# Patient Record
Sex: Male | Born: 1935 | Race: White | Hispanic: No | Marital: Single | Smoking: Former smoker
Health system: Southern US, Community
[De-identification: ages and names within clinical notes are randomized; demographics above are authoritative.]

## PROBLEM LIST (undated history)

## (undated) DIAGNOSIS — E039 Hypothyroidism, unspecified: Secondary | ICD-10-CM

## (undated) DIAGNOSIS — N183 Chronic kidney disease, stage 3 unspecified: Secondary | ICD-10-CM

## (undated) DIAGNOSIS — R259 Unspecified abnormal involuntary movements: Secondary | ICD-10-CM

## (undated) DIAGNOSIS — K219 Gastro-esophageal reflux disease without esophagitis: Secondary | ICD-10-CM

## (undated) DIAGNOSIS — G252 Other specified forms of tremor: Secondary | ICD-10-CM

## (undated) DIAGNOSIS — I10 Essential (primary) hypertension: Secondary | ICD-10-CM

## (undated) DIAGNOSIS — M1990 Unspecified osteoarthritis, unspecified site: Secondary | ICD-10-CM

## (undated) HISTORY — DX: Unspecified abnormal involuntary movements: R25.9

## (undated) HISTORY — DX: Essential (primary) hypertension: I10

## (undated) HISTORY — DX: Unspecified osteoarthritis, unspecified site: M19.90

## (undated) HISTORY — DX: Other specified forms of tremor: G25.2

## (undated) HISTORY — DX: Hypothyroidism, unspecified: E03.9

## (undated) HISTORY — PX: APPENDECTOMY: SHX54

## (undated) HISTORY — PX: CHOLECYSTECTOMY: SHX55

---

## 2001-01-03 ENCOUNTER — Ambulatory Visit (HOSPITAL_COMMUNITY): Admission: RE | Admit: 2001-01-03 | Discharge: 2001-01-03 | Payer: Self-pay | Admitting: Cardiology

## 2001-01-11 ENCOUNTER — Encounter: Payer: Self-pay | Admitting: General Surgery

## 2001-01-11 ENCOUNTER — Ambulatory Visit (HOSPITAL_COMMUNITY): Admission: RE | Admit: 2001-01-11 | Discharge: 2001-01-12 | Payer: Self-pay | Admitting: General Surgery

## 2002-11-08 ENCOUNTER — Ambulatory Visit (HOSPITAL_COMMUNITY): Admission: RE | Admit: 2002-11-08 | Discharge: 2002-11-08 | Payer: Self-pay | Admitting: Interventional Cardiology

## 2003-10-03 ENCOUNTER — Encounter: Admission: RE | Admit: 2003-10-03 | Discharge: 2003-10-03 | Payer: Self-pay | Admitting: Family Medicine

## 2004-08-20 ENCOUNTER — Encounter: Admission: RE | Admit: 2004-08-20 | Discharge: 2004-08-20 | Payer: Self-pay | Admitting: *Deleted

## 2006-09-27 ENCOUNTER — Ambulatory Visit (HOSPITAL_COMMUNITY): Admission: RE | Admit: 2006-09-27 | Discharge: 2006-09-27 | Payer: Self-pay | Admitting: *Deleted

## 2008-08-07 ENCOUNTER — Emergency Department (HOSPITAL_COMMUNITY): Admission: EM | Admit: 2008-08-07 | Discharge: 2008-08-07 | Payer: Self-pay | Admitting: Emergency Medicine

## 2008-08-23 ENCOUNTER — Ambulatory Visit: Payer: Self-pay | Admitting: Pulmonary Disease

## 2008-08-23 DIAGNOSIS — J8410 Pulmonary fibrosis, unspecified: Secondary | ICD-10-CM

## 2010-09-29 ENCOUNTER — Encounter
Admission: RE | Admit: 2010-09-29 | Discharge: 2010-09-29 | Payer: Self-pay | Source: Home / Self Care | Attending: Chiropractic Medicine | Admitting: Chiropractic Medicine

## 2010-10-10 ENCOUNTER — Observation Stay (HOSPITAL_COMMUNITY)
Admission: EM | Admit: 2010-10-10 | Discharge: 2010-10-11 | Payer: Self-pay | Source: Home / Self Care | Attending: Internal Medicine | Admitting: Internal Medicine
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
## 2010-10-13 LAB — BASIC METABOLIC PANEL
BUN: 13 mg/dL (ref 6–23)
CO2: 24 mEq/L (ref 19–32)
Calcium: 8.7 mg/dL (ref 8.4–10.5)
Calcium: 8.8 mg/dL (ref 8.4–10.5)
Chloride: 106 mEq/L (ref 96–112)
Creatinine, Ser: 1.3 mg/dL (ref 0.4–1.5)
GFR calc Af Amer: >60 mL/min (ref >60)
GFR calc Af Amer: >60 mL/min (ref >60)
Glucose, Bld: 110 mg/dL — ABNORMAL HIGH (ref 70–99)
Sodium: 134 mEq/L — ABNORMAL LOW (ref 135–145)
Sodium: 138 mEq/L (ref 135–145)

## 2010-10-13 LAB — CBC
HCT: 26.6 % — ABNORMAL LOW (ref 39.0–52.0)
HCT: 27.4 % — ABNORMAL LOW (ref 39.0–52.0)
Hemoglobin: 9.1 g/dL — ABNORMAL LOW (ref 13.0–17.0)
Hemoglobin: 8.8 g/dL — ABNORMAL LOW (ref 13.0–17.0)
MCH: 32.4 pg (ref 26.0–34.0)
MCH: 32.7 pg (ref 26.0–34.0)
MCHC: 33.3 g/dL (ref 30.0–36.0)
MCV: 97.5 fL (ref 78.0–100.0)
Platelets: 170 10*3/uL (ref 150–400)
RBC: 2.71 MIL/uL — ABNORMAL LOW (ref 4.22–5.81)
RDW: 15.6 % — ABNORMAL HIGH (ref 11.5–15.5)
RDW: 15.7 % — ABNORMAL HIGH (ref 11.5–15.5)
WBC: 5 10*3/uL (ref 4.0–10.5)
WBC: 5.2 10*3/uL (ref 4.0–10.5)

## 2010-10-13 LAB — COMPREHENSIVE METABOLIC PANEL
ALT: 27 U/L (ref 0–53)
AST: 81 U/L — ABNORMAL HIGH (ref 0–37)
Albumin: 3.2 g/dL — ABNORMAL LOW (ref 3.5–5.2)
BUN: 14 mg/dL (ref 6–23)
BUN: 13 mg/dL (ref 6–23)
CO2: 24 mEq/L (ref 19–32)
Calcium: 8.7 mg/dL (ref 8.4–10.5)
Creatinine, Ser: 1.32 mg/dL (ref 0.4–1.5)
Creatinine, Ser: 1.27 mg/dL (ref 0.4–1.5)
GFR calc non Af Amer: 53 mL/min — ABNORMAL LOW (ref >60)
Glucose, Bld: 107 mg/dL — ABNORMAL HIGH (ref 70–99)
Potassium: 5.6 mEq/L — ABNORMAL HIGH (ref 3.5–5.1)
Sodium: 136 mEq/L (ref 135–145)
Sodium: 139 mEq/L (ref 135–145)
Total Bilirubin: 1.5 mg/dL — ABNORMAL HIGH (ref 0.3–1.2)
Total Bilirubin: 0.9 mg/dL (ref 0.3–1.2)
Total Protein: 7.2 g/dL (ref 6.0–8.3)

## 2010-10-13 LAB — URINALYSIS, ROUTINE W REFLEX MICROSCOPIC
Leukocytes, UA: NEGATIVE
Urine Glucose, Fasting: NEGATIVE mg/dL

## 2010-10-13 LAB — DIFFERENTIAL
Basophils Absolute: 0 10*3/uL (ref 0.0–0.1)
Basophils Absolute: 0 10*3/uL (ref 0.0–0.1)
Basophils Relative: 1 % (ref 0–1)
Eosinophils Absolute: 0.2 10*3/uL (ref 0.0–0.7)
Eosinophils Relative: 4 % (ref 0–5)
Lymphs Abs: 1.8 10*3/uL (ref 0.7–4.0)
Lymphs Abs: 1.9 10*3/uL (ref 0.7–4.0)
Monocytes Absolute: 0.6 10*3/uL (ref 0.1–1.0)
Monocytes Relative: 10 % (ref 3–12)
Monocytes Relative: 11 % (ref 3–12)
Neutro Abs: 2.5 10*3/uL (ref 1.7–7.7)
Neutrophils Relative %: 48 % (ref 43–77)

## 2010-10-13 LAB — IRON AND TIBC
Iron: 46 ug/dL (ref 42–135)
Saturation Ratios: 19 % — ABNORMAL LOW (ref 20–55)
TIBC: 245 ug/dL (ref 215–435)

## 2010-10-13 LAB — POCT CARDIAC MARKERS: CKMB, poc: <1 ng/mL — ABNORMAL LOW (ref 1.0–8.0)

## 2010-10-13 LAB — CARDIAC PANEL(CRET KIN+CKTOT+MB+TROPI)
Total CK: 267 U/L — ABNORMAL HIGH (ref 7–232)
Troponin I: 0.02 ng/mL (ref 0.00–0.06)

## 2010-10-13 LAB — PROTIME-INR: INR: 1.28 (ref 0.00–1.49)

## 2010-10-22 ENCOUNTER — Other Ambulatory Visit: Payer: Self-pay | Admitting: Gastroenterology

## 2011-02-05 NOTE — Cardiovascular Report (Signed)
NAME:  Bernard [name], WOLDEN NO.:  [id number]   MEDICAL RECORD NO.:  [id number]          PATIENT TYPE:  OIB   LOCATION:  2899                         FACILITY:  [facility]   PHYSICIAN:  Elmore Guise., M.D.DATE OF BIRTH:  1919-06-18   DATE OF PROCEDURE:  09/27/2006  DATE OF DISCHARGE:                            CARDIAC CATHETERIZATION   INDICATIONS FOR PROCEDURE:  Increasing exertional chest pain with  multiple cardiac risk factors.   PROCEDURE DESCRIPTION:  The patient was brought to the cardiac cath lab  after appropriate informed consent.  He was prepped and draped in  sterile fashion.  Approximately 10 mL of 1% lidocaine was used for local  anesthesia.  A 6-French sheath was placed in the right femoral artery  without difficulty.  Coronary angiography, LV angiography and limited  right femoral arteriography were then performed.  The patient tolerated  the procedure well with no apparent complications.  The patient was  transferred from the cardiac catheterization lab in stable condition.   FINDINGS:  1. Left main:  Normal.  2. Left anterior descending:  Proximal luminal irregularities with mid      20-30% stenosis and distal luminal irregularities.  3. Diagonal 1:  Small vessel with mild luminal irregularities.  4. Diagonal 2:  Moderate-sized vessel with mild luminal      irregularities.  5. Left circumflex:  Nondominant with mild luminal irregularities.  6. Obtuse marginal 1/obtuse marginal 2:  Both large vessels with mild      luminal irregularities.  7. Right coronary artery:  Dominant with mild luminal irregularities.  8. PDA/PLV:  Mild luminal irregularities.  9. Left ventricle:  Ejection fraction is 50%.  LVEDP is 21 mmHg.  10.Limited right femoral angiogram showed mild tortuosity but no      significant obstructive disease   IMPRESSION:  1. Nonobstructive coronary arteries with mild luminal irregularities      as described.  2. Preserved left  ventricular systolic function with an ejection      fraction of 50-55%.   PLAN:  Aggressive risk factor modification as indicated.      Elmore Guise., M.D.  Electronically Signed     TWK/MEDQ  D:  09/27/2006  T:  09/27/2006  Job:  [id number]   cc:   Caryn Bee L. Little, M.D.

## 2011-02-12 ENCOUNTER — Other Ambulatory Visit: Payer: Self-pay | Admitting: Family Medicine

## 2011-02-12 DIAGNOSIS — N289 Disorder of kidney and ureter, unspecified: Secondary | ICD-10-CM

## 2011-02-16 ENCOUNTER — Ambulatory Visit
Admission: RE | Admit: 2011-02-16 | Discharge: 2011-02-16 | Disposition: A | Discharge status: Home / Self Care | Payer: Medicare Other | Source: Ambulatory Visit | Attending: Family Medicine | Admitting: Family Medicine

## 2011-02-16 DIAGNOSIS — N289 Disorder of kidney and ureter, unspecified: Secondary | ICD-10-CM

## 2011-06-23 LAB — URINALYSIS, ROUTINE W REFLEX MICROSCOPIC
Glucose, UA: NEGATIVE
Specific Gravity, Urine: 1.02
pH: 5.5

## 2011-06-23 LAB — CBC
Hemoglobin: 10.9 — ABNORMAL LOW
RBC: 3.24 — ABNORMAL LOW
RDW: 14.1
WBC: 9.1

## 2011-06-23 LAB — COMPREHENSIVE METABOLIC PANEL
ALT: 28
Alkaline Phosphatase: 85
CO2: 26
Chloride: 108
Glucose, Bld: 98
Potassium: 6 — ABNORMAL HIGH
Sodium: 139
Total Bilirubin: 2.3 — ABNORMAL HIGH
Total Protein: 7.3

## 2011-06-23 LAB — DIFFERENTIAL
Basophils Absolute: 0
Basophils Relative: 0
Eosinophils Absolute: 0.2
Eosinophils Relative: 2
Lymphocytes Relative: 14
Lymphs Abs: 1.2
Monocytes Absolute: 0.7
Monocytes Relative: 8
Neutro Abs: 7
Neutrophils Relative %: 76

## 2011-06-23 LAB — POCT CARDIAC MARKERS: CKMB, poc: 1.6

## 2011-06-23 LAB — URINE MICROSCOPIC-ADD ON: Urine-Other: NONE SEEN

## 2011-06-23 LAB — LIPASE, BLOOD: Lipase: 102 — ABNORMAL HIGH

## 2012-02-24 ENCOUNTER — Other Ambulatory Visit: Payer: Self-pay | Admitting: Family Medicine

## 2012-02-24 DIAGNOSIS — J309 Allergic rhinitis, unspecified: Secondary | ICD-10-CM

## 2012-02-25 ENCOUNTER — Ambulatory Visit
Admission: RE | Admit: 2012-02-25 | Discharge: 2012-02-25 | Disposition: A | Discharge status: Home / Self Care | Payer: Medicare Other | Source: Ambulatory Visit | Attending: Family Medicine | Admitting: Family Medicine

## 2012-02-25 DIAGNOSIS — J309 Allergic rhinitis, unspecified: Secondary | ICD-10-CM

## 2012-03-02 ENCOUNTER — Encounter: Payer: Self-pay | Admitting: *Deleted

## 2012-03-23 IMAGING — CR DG LUMBAR SPINE COMPLETE 4+V
5 series · 5 of 5 positions shown · non-contrast
Comparison: None

CLINICAL DATA: Upper, mid and low back pain, leg pain

LUMBAR SPINE - COMPLETE 4+ VIEW

[t l-spine a.p.]
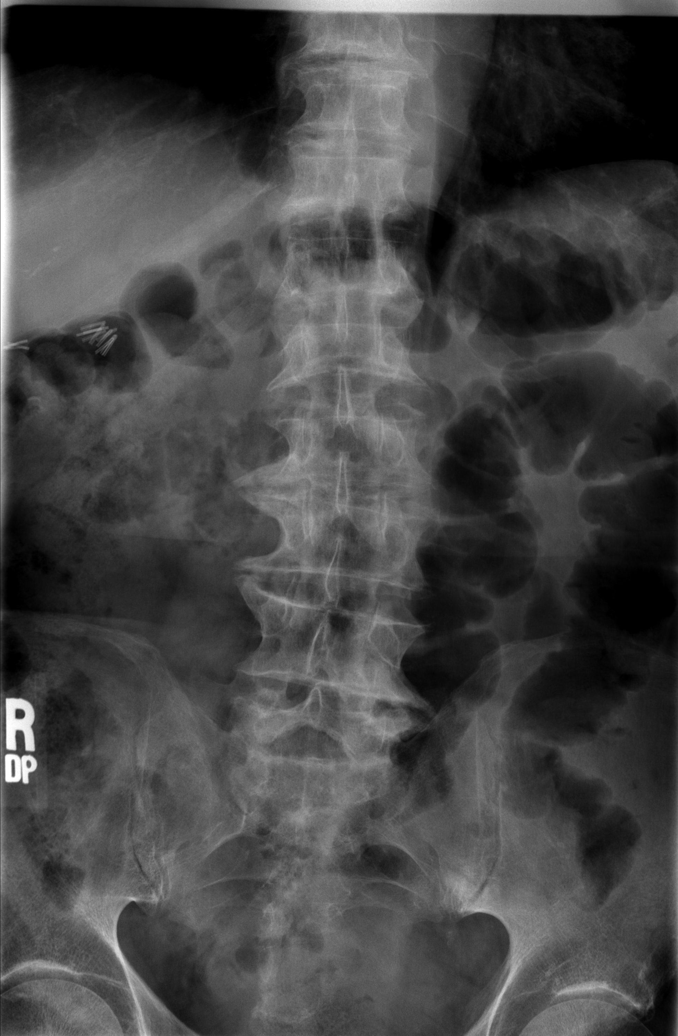

[t l-spine oblique exposure (1 of 2)]
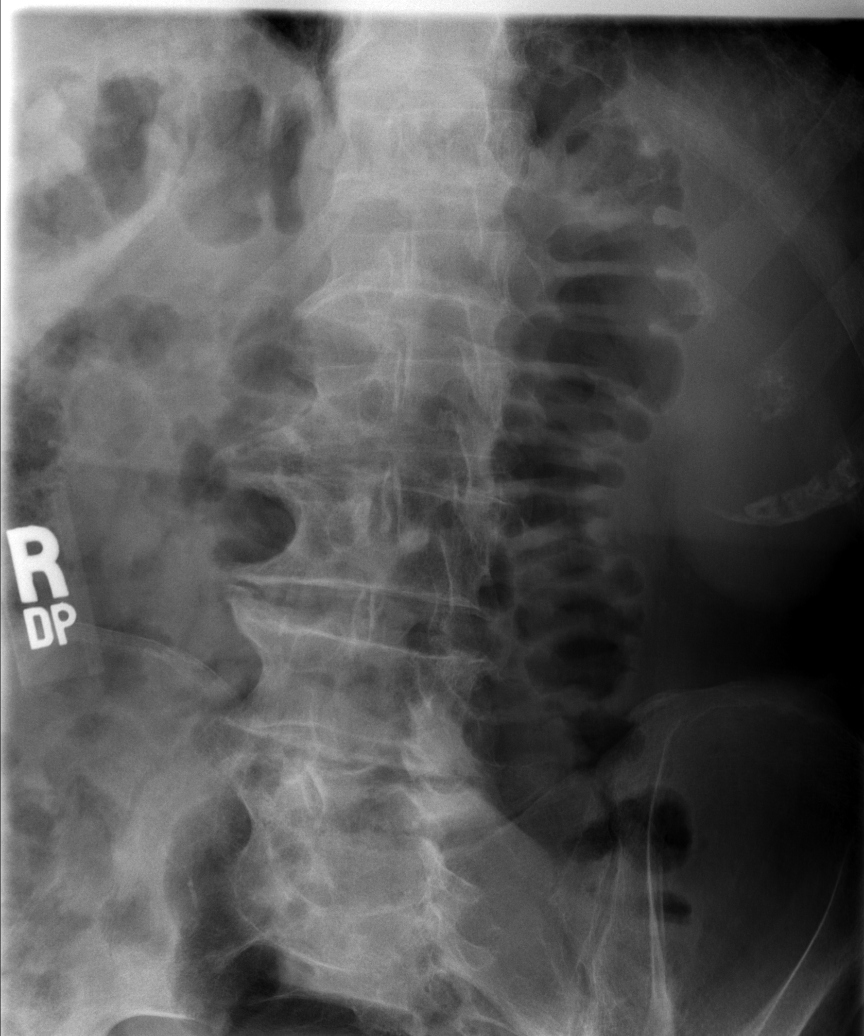

[t l-spine oblique exposure (2 of 2)]
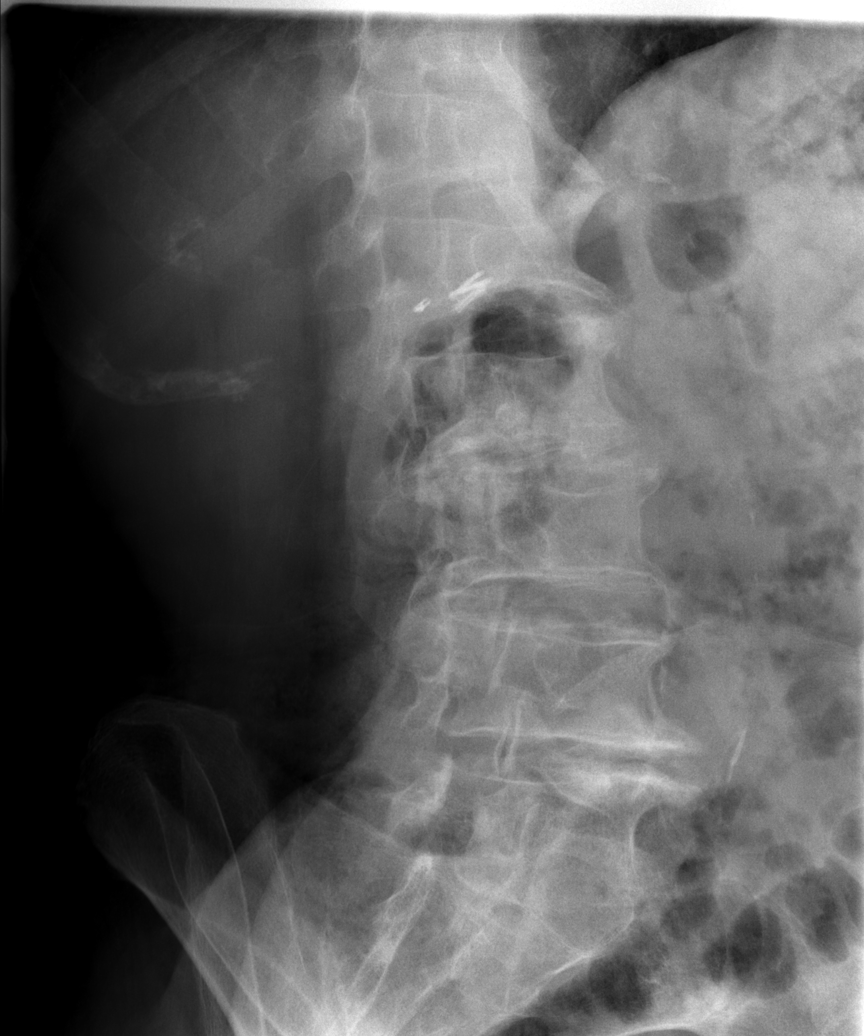

[t l-spine lat]
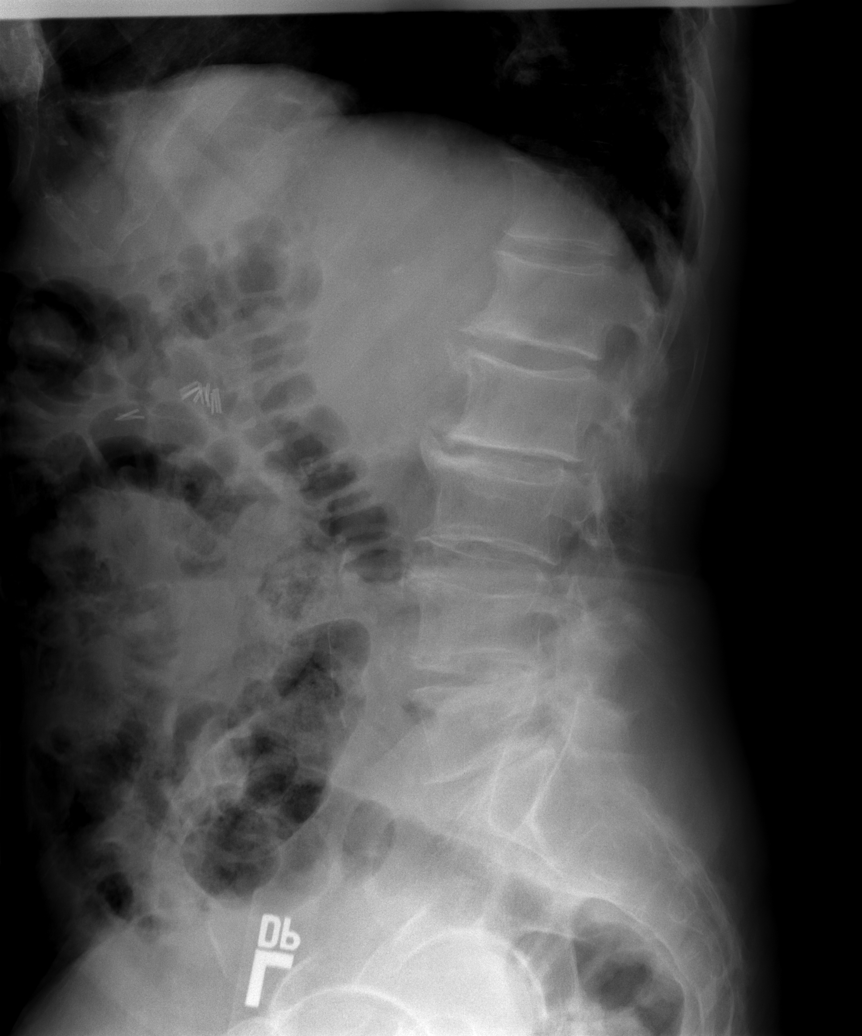

[t l-spine l5-s1 spot]
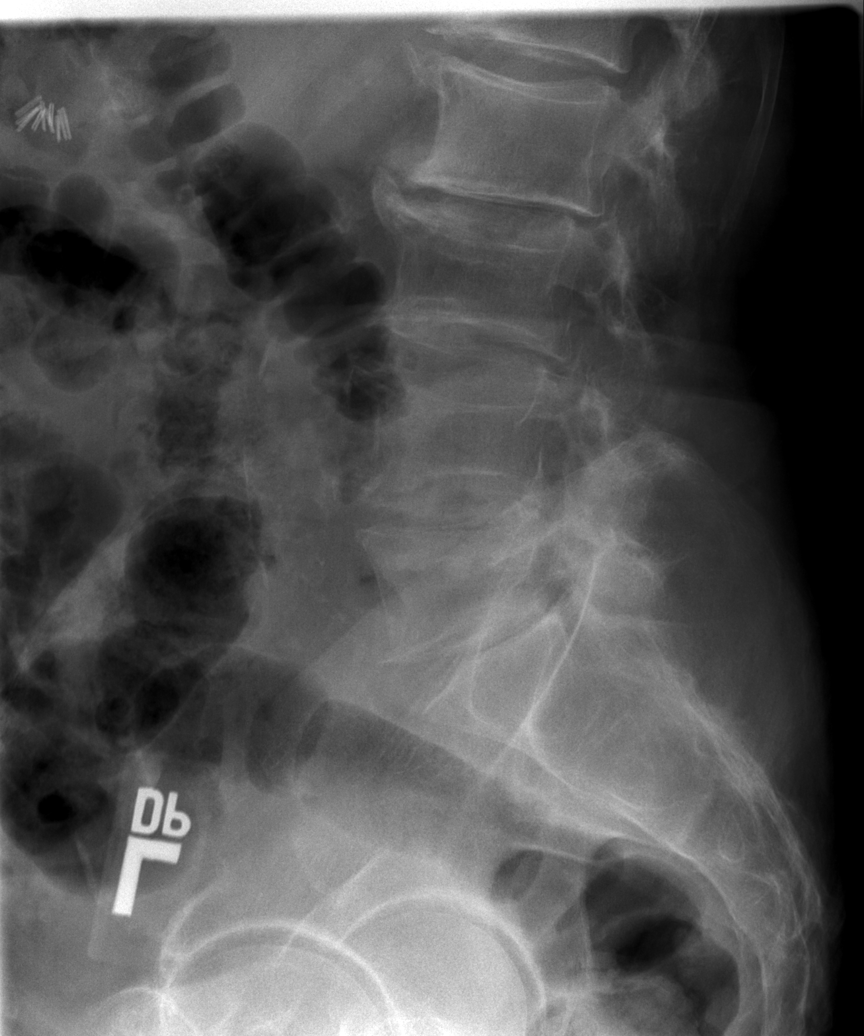

[5 of 5 positions shown; findings below may reference images not displayed]

FINDINGS: Five non-rib bearing lumbar vertebrae.
Osseous demineralization.
Multilevel disc space narrowing endplate spur formation.
Mild facet degenerative changes lower lumbar spine.
No acute fracture, subluxation or bone destruction.
No spondylolysis.
SI joints symmetric.
IMPRESSION: Multilevel degenerative disc disease changes thoracic spine.
No acute osseous findings.

## 2012-04-25 ENCOUNTER — Other Ambulatory Visit: Payer: Self-pay | Admitting: Family Medicine

## 2012-04-25 DIAGNOSIS — R109 Unspecified abdominal pain: Secondary | ICD-10-CM

## 2012-04-27 ENCOUNTER — Ambulatory Visit
Admission: RE | Admit: 2012-04-27 | Discharge: 2012-04-27 | Disposition: A | Discharge status: Home / Self Care | Payer: Medicare Other | Source: Ambulatory Visit | Attending: Family Medicine | Admitting: Family Medicine

## 2012-04-27 DIAGNOSIS — R109 Unspecified abdominal pain: Secondary | ICD-10-CM

## 2012-10-12 ENCOUNTER — Other Ambulatory Visit: Payer: Self-pay | Admitting: Neurology

## 2012-10-12 DIAGNOSIS — G243 Spasmodic torticollis: Secondary | ICD-10-CM

## 2012-10-12 DIAGNOSIS — E039 Hypothyroidism, unspecified: Secondary | ICD-10-CM

## 2012-10-12 DIAGNOSIS — R251 Tremor, unspecified: Secondary | ICD-10-CM

## 2012-10-12 DIAGNOSIS — R51 Headache: Secondary | ICD-10-CM

## 2012-10-18 ENCOUNTER — Other Ambulatory Visit: Payer: Medicare Other

## 2012-11-06 ENCOUNTER — Other Ambulatory Visit: Payer: Medicare Other

## 2012-11-13 ENCOUNTER — Encounter: Payer: Self-pay | Admitting: *Deleted

## 2013-05-08 ENCOUNTER — Encounter (HOSPITAL_COMMUNITY): Payer: Self-pay | Admitting: Emergency Medicine

## 2013-05-08 ENCOUNTER — Emergency Department (HOSPITAL_COMMUNITY)
Admission: EM | Admit: 2013-05-08 | Discharge: 2013-05-08 | Disposition: A | Discharge status: Home / Self Care | Payer: Medicare Other | Attending: Emergency Medicine | Admitting: Emergency Medicine

## 2013-05-08 DIAGNOSIS — I10 Essential (primary) hypertension: Secondary | ICD-10-CM | POA: Insufficient documentation

## 2013-05-08 DIAGNOSIS — Z87891 Personal history of nicotine dependence: Secondary | ICD-10-CM | POA: Insufficient documentation

## 2013-05-08 DIAGNOSIS — Z79899 Other long term (current) drug therapy: Secondary | ICD-10-CM | POA: Insufficient documentation

## 2013-05-08 DIAGNOSIS — F0390 Unspecified dementia without behavioral disturbance: Secondary | ICD-10-CM | POA: Insufficient documentation

## 2013-05-08 DIAGNOSIS — E039 Hypothyroidism, unspecified: Secondary | ICD-10-CM | POA: Insufficient documentation

## 2013-05-08 DIAGNOSIS — Z8739 Personal history of other diseases of the musculoskeletal system and connective tissue: Secondary | ICD-10-CM | POA: Insufficient documentation
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
## 2013-05-28 ENCOUNTER — Other Ambulatory Visit: Payer: Self-pay | Admitting: Family Medicine

## 2013-05-28 DIAGNOSIS — R413 Other amnesia: Secondary | ICD-10-CM

## 2013-05-30 ENCOUNTER — Other Ambulatory Visit: Payer: Medicare Other

## 2013-06-02 ENCOUNTER — Ambulatory Visit
Admission: RE | Admit: 2013-06-02 | Discharge: 2013-06-02 | Disposition: A | Discharge status: Home / Self Care | Payer: Medicare Other | Source: Ambulatory Visit | Attending: Family Medicine | Admitting: Family Medicine

## 2013-06-02 ENCOUNTER — Other Ambulatory Visit: Payer: Medicare Other

## 2013-06-02 DIAGNOSIS — R413 Other amnesia: Secondary | ICD-10-CM

## 2014-05-25 ENCOUNTER — Encounter: Payer: Self-pay | Admitting: *Deleted

## 2015-08-23 ENCOUNTER — Inpatient Hospital Stay
Admission: EM | Admit: 2015-08-23 | Discharge: 2015-08-27 | DRG: 682 | Disposition: A | Discharge status: Skilled Nursing Facility | Payer: Medicare Other | Attending: Internal Medicine | Admitting: Internal Medicine

## 2015-08-23 DIAGNOSIS — N179 Acute kidney failure, unspecified: Secondary | ICD-10-CM

## 2015-08-23 DIAGNOSIS — I130 Hypertensive heart and chronic kidney disease with heart failure and stage 1 through stage 4 chronic kidney disease, or unspecified chronic kidney disease: Secondary | ICD-10-CM | POA: Diagnosis present

## 2015-08-23 DIAGNOSIS — N170 Acute kidney failure with tubular necrosis: Secondary | ICD-10-CM | POA: Diagnosis not present

## 2015-08-23 DIAGNOSIS — R627 Adult failure to thrive: Secondary | ICD-10-CM | POA: Diagnosis present

## 2015-08-23 DIAGNOSIS — D61818 Other pancytopenia: Secondary | ICD-10-CM | POA: Diagnosis present

## 2015-08-23 DIAGNOSIS — F0390 Unspecified dementia without behavioral disturbance: Secondary | ICD-10-CM | POA: Diagnosis present

## 2015-08-23 DIAGNOSIS — Z515 Encounter for palliative care: Secondary | ICD-10-CM | POA: Diagnosis present

## 2015-08-23 DIAGNOSIS — M1990 Unspecified osteoarthritis, unspecified site: Secondary | ICD-10-CM | POA: Diagnosis present

## 2015-08-23 DIAGNOSIS — I350 Nonrheumatic aortic (valve) stenosis: Secondary | ICD-10-CM | POA: Diagnosis present

## 2015-08-23 DIAGNOSIS — E43 Unspecified severe protein-calorie malnutrition: Secondary | ICD-10-CM | POA: Insufficient documentation

## 2015-08-23 DIAGNOSIS — Z66 Do not resuscitate: Secondary | ICD-10-CM | POA: Diagnosis present

## 2015-08-23 DIAGNOSIS — E872 Acidosis: Secondary | ICD-10-CM | POA: Diagnosis present

## 2015-08-23 DIAGNOSIS — Z9049 Acquired absence of other specified parts of digestive tract: Secondary | ICD-10-CM

## 2015-08-23 DIAGNOSIS — Z87891 Personal history of nicotine dependence: Secondary | ICD-10-CM

## 2015-08-23 DIAGNOSIS — J8410 Pulmonary fibrosis, unspecified: Secondary | ICD-10-CM | POA: Diagnosis present

## 2015-08-23 DIAGNOSIS — N189 Chronic kidney disease, unspecified: Secondary | ICD-10-CM | POA: Diagnosis present

## 2015-08-23 DIAGNOSIS — K922 Gastrointestinal hemorrhage, unspecified: Secondary | ICD-10-CM

## 2015-08-23 DIAGNOSIS — I739 Peripheral vascular disease, unspecified: Secondary | ICD-10-CM | POA: Diagnosis present

## 2015-08-23 DIAGNOSIS — I272 Other secondary pulmonary hypertension: Secondary | ICD-10-CM | POA: Diagnosis present

## 2015-08-23 DIAGNOSIS — E039 Hypothyroidism, unspecified: Secondary | ICD-10-CM | POA: Diagnosis present

## 2015-08-23 DIAGNOSIS — J439 Emphysema, unspecified: Secondary | ICD-10-CM | POA: Diagnosis present

## 2015-08-23 DIAGNOSIS — I5032 Chronic diastolic (congestive) heart failure: Secondary | ICD-10-CM | POA: Diagnosis present

## 2015-08-23 DIAGNOSIS — D649 Anemia, unspecified: Secondary | ICD-10-CM

## 2015-08-23 DIAGNOSIS — E860 Dehydration: Secondary | ICD-10-CM | POA: Diagnosis not present

## 2015-08-23 DIAGNOSIS — N400 Enlarged prostate without lower urinary tract symptoms: Secondary | ICD-10-CM | POA: Diagnosis present

## 2015-08-23 DIAGNOSIS — I2510 Atherosclerotic heart disease of native coronary artery without angina pectoris: Secondary | ICD-10-CM | POA: Diagnosis present

## 2015-08-23 DIAGNOSIS — D414 Neoplasm of uncertain behavior of bladder: Secondary | ICD-10-CM | POA: Diagnosis present

## 2015-08-23 LAB — COMPREHENSIVE METABOLIC PANEL
ALK PHOS: 78 U/L (ref 38–126)
ALT: 20 U/L (ref 17–63)
ANION GAP: 12 (ref 5–15)
AST: 74 U/L — ABNORMAL HIGH (ref 15–41)
Albumin: 3.9 g/dL (ref 3.5–5.0)
BILIRUBIN TOTAL: 3.5 mg/dL — AB (ref 0.3–1.2)
BUN: 59 mg/dL — ABNORMAL HIGH (ref 6–20)
CALCIUM: 9.5 mg/dL (ref 8.9–10.3)
CO2: 21 mmol/L — ABNORMAL LOW (ref 22–32)
Chloride: 104 mmol/L (ref 101–111)
Creatinine, Ser: 2.81 mg/dL — ABNORMAL HIGH (ref 0.61–1.24)
GFR calc non Af Amer: 18 mL/min — ABNORMAL LOW (ref >60)
GFR, EST AFRICAN AMERICAN: 21 mL/min — AB (ref >60)
Glucose, Bld: 153 mg/dL — ABNORMAL HIGH (ref 65–99)
Potassium: 4.7 mmol/L (ref 3.5–5.1)
Sodium: 137 mmol/L (ref 135–145)
TOTAL PROTEIN: 7.4 g/dL (ref 6.5–8.1)

## 2015-08-23 LAB — CBC WITH DIFFERENTIAL/PLATELET
Basophils Absolute: 0.1 10*3/uL (ref 0–0.1)
Basophils Relative: 0 %
Eosinophils Absolute: 0.1 10*3/uL (ref 0–0.7)
Eosinophils Relative: 1 %
HEMATOCRIT: 34.2 % — AB (ref 40.0–52.0)
Hemoglobin: 11.8 g/dL — ABNORMAL LOW (ref 13.0–18.0)
LYMPHS PCT: 26 %
Lymphs Abs: 3.3 10*3/uL (ref 1.0–3.6)
MCH: 31.3 pg (ref 26.0–34.0)
MCHC: 34.4 g/dL (ref 32.0–36.0)
MCV: 90.7 fL (ref 80.0–100.0)
MONO ABS: 1.1 10*3/uL — AB (ref 0.2–1.0)
MONOS PCT: 9 %
NEUTROS ABS: 8.1 10*3/uL — AB (ref 1.4–6.5)
Neutrophils Relative %: 64 %
Platelets: 275 10*3/uL (ref 150–440)
RBC: 3.77 MIL/uL — ABNORMAL LOW (ref 4.40–5.90)
RDW: 17.9 % — AB (ref 11.5–14.5)
WBC: 12.6 10*3/uL — ABNORMAL HIGH (ref 3.8–10.6)

## 2015-08-23 LAB — PROTIME-INR
INR: 1.5
PROTHROMBIN TIME: 18.2 s — AB (ref 11.4–15.0)

## 2015-08-23 LAB — LIPASE, BLOOD: Lipase: 39 U/L (ref 11–51)

## 2015-08-23 LAB — LACTIC ACID, PLASMA: Lactic Acid, Venous: 1.9 mmol/L (ref 0.5–2.0)

## 2015-08-23 MED ORDER — SODIUM CHLORIDE 0.9 % IV BOLUS (SEPSIS)
500.0000 mL | Freq: Once | INTRAVENOUS | Status: AC
Start: 2015-08-23 — End: 2015-08-24
  Administered 2015-08-23: 500 mL via INTRAVENOUS

## 2015-08-23 NOTE — ED Notes (Signed)
Pt. Here from the [facility] for nausea, vomiting and diarrhea.

## 2015-08-24 ENCOUNTER — Inpatient Hospital Stay: Payer: Medicare Other

## 2015-08-24 ENCOUNTER — Encounter: Payer: Self-pay | Admitting: Internal Medicine

## 2015-08-24 DIAGNOSIS — I350 Nonrheumatic aortic (valve) stenosis: Secondary | ICD-10-CM | POA: Diagnosis present

## 2015-08-24 DIAGNOSIS — N170 Acute kidney failure with tubular necrosis: Secondary | ICD-10-CM | POA: Diagnosis present

## 2015-08-24 DIAGNOSIS — D61818 Other pancytopenia: Secondary | ICD-10-CM | POA: Diagnosis present

## 2015-08-24 DIAGNOSIS — Z66 Do not resuscitate: Secondary | ICD-10-CM | POA: Diagnosis present

## 2015-08-24 DIAGNOSIS — E860 Dehydration: Secondary | ICD-10-CM | POA: Diagnosis present

## 2015-08-24 DIAGNOSIS — E872 Acidosis: Secondary | ICD-10-CM | POA: Diagnosis present

## 2015-08-24 DIAGNOSIS — E039 Hypothyroidism, unspecified: Secondary | ICD-10-CM | POA: Diagnosis present

## 2015-08-24 DIAGNOSIS — I5032 Chronic diastolic (congestive) heart failure: Secondary | ICD-10-CM | POA: Diagnosis present

## 2015-08-24 DIAGNOSIS — I739 Peripheral vascular disease, unspecified: Secondary | ICD-10-CM | POA: Diagnosis present

## 2015-08-24 DIAGNOSIS — N189 Chronic kidney disease, unspecified: Secondary | ICD-10-CM | POA: Diagnosis present

## 2015-08-24 DIAGNOSIS — I130 Hypertensive heart and chronic kidney disease with heart failure and stage 1 through stage 4 chronic kidney disease, or unspecified chronic kidney disease: Secondary | ICD-10-CM | POA: Diagnosis present

## 2015-08-24 DIAGNOSIS — J439 Emphysema, unspecified: Secondary | ICD-10-CM | POA: Diagnosis present

## 2015-08-24 DIAGNOSIS — M1990 Unspecified osteoarthritis, unspecified site: Secondary | ICD-10-CM | POA: Diagnosis present

## 2015-08-24 DIAGNOSIS — Z9049 Acquired absence of other specified parts of digestive tract: Secondary | ICD-10-CM | POA: Diagnosis not present

## 2015-08-24 DIAGNOSIS — D414 Neoplasm of uncertain behavior of bladder: Secondary | ICD-10-CM | POA: Diagnosis present

## 2015-08-24 DIAGNOSIS — Z87891 Personal history of nicotine dependence: Secondary | ICD-10-CM | POA: Diagnosis not present

## 2015-08-24 DIAGNOSIS — D649 Anemia, unspecified: Secondary | ICD-10-CM | POA: Diagnosis present

## 2015-08-24 DIAGNOSIS — R627 Adult failure to thrive: Secondary | ICD-10-CM | POA: Diagnosis present

## 2015-08-24 DIAGNOSIS — N179 Acute kidney failure, unspecified: Secondary | ICD-10-CM | POA: Diagnosis present

## 2015-08-24 DIAGNOSIS — I272 Other secondary pulmonary hypertension: Secondary | ICD-10-CM | POA: Diagnosis present

## 2015-08-24 DIAGNOSIS — E43 Unspecified severe protein-calorie malnutrition: Secondary | ICD-10-CM | POA: Diagnosis present

## 2015-08-24 DIAGNOSIS — I2510 Atherosclerotic heart disease of native coronary artery without angina pectoris: Secondary | ICD-10-CM | POA: Diagnosis present

## 2015-08-24 DIAGNOSIS — F0390 Unspecified dementia without behavioral disturbance: Secondary | ICD-10-CM | POA: Diagnosis not present

## 2015-08-24 DIAGNOSIS — R109 Unspecified abdominal pain: Secondary | ICD-10-CM | POA: Diagnosis not present

## 2015-08-24 DIAGNOSIS — N400 Enlarged prostate without lower urinary tract symptoms: Secondary | ICD-10-CM | POA: Diagnosis present

## 2015-08-24 DIAGNOSIS — J8410 Pulmonary fibrosis, unspecified: Secondary | ICD-10-CM | POA: Diagnosis present

## 2015-08-24 DIAGNOSIS — Z515 Encounter for palliative care: Secondary | ICD-10-CM | POA: Diagnosis present

## 2015-08-24 LAB — URINALYSIS COMPLETE WITH MICROSCOPIC (ARMC ONLY)
Bilirubin Urine: NEGATIVE
GLUCOSE, UA: NEGATIVE mg/dL
LEUKOCYTES UA: NEGATIVE
NITRITE: NEGATIVE
PROTEIN: 30 mg/dL — AB
SPECIFIC GRAVITY, URINE: 1.016 (ref 1.005–1.030)
WBC UA: NONE SEEN WBC/hpf (ref 0–5)
pH: 5 (ref 5.0–8.0)

## 2015-08-24 LAB — CBC
HCT: 30.3 % — ABNORMAL LOW (ref 40.0–52.0)
Hemoglobin: 10.5 g/dL — ABNORMAL LOW (ref 13.0–18.0)
MCH: 31.7 pg (ref 26.0–34.0)
MCHC: 34.7 g/dL (ref 32.0–36.0)
MCV: 91.2 fL (ref 80.0–100.0)
PLATELETS: 196 10*3/uL (ref 150–440)
RBC: 3.32 MIL/uL — AB (ref 4.40–5.90)
RDW: 17.8 % — ABNORMAL HIGH (ref 11.5–14.5)
WBC: 10.6 10*3/uL (ref 3.8–10.6)

## 2015-08-24 LAB — BASIC METABOLIC PANEL
Anion gap: 9 (ref 5–15)
BUN: 58 mg/dL — ABNORMAL HIGH (ref 6–20)
CALCIUM: 9.2 mg/dL (ref 8.9–10.3)
CO2: 23 mmol/L (ref 22–32)
CREATININE: 2.66 mg/dL — AB (ref 0.61–1.24)
Chloride: 108 mmol/L (ref 101–111)
GFR calc non Af Amer: 19 mL/min — ABNORMAL LOW (ref >60)
GFR, EST AFRICAN AMERICAN: 22 mL/min — AB (ref >60)
Glucose, Bld: 98 mg/dL (ref 65–99)
Potassium: 4.5 mmol/L (ref 3.5–5.1)
SODIUM: 140 mmol/L (ref 135–145)

## 2015-08-24 LAB — LACTIC ACID, PLASMA: LACTIC ACID, VENOUS: 1 mmol/L (ref 0.5–2.0)

## 2015-08-24 MED ORDER — OXYCODONE HCL 5 MG PO TABS
5.0000 mg | ORAL_TABLET | ORAL | Status: DC | PRN
Start: 2015-08-24 — End: 2015-08-27

## 2015-08-24 MED ORDER — ONDANSETRON HCL 4 MG PO TABS
4.0000 mg | ORAL_TABLET | Freq: Four times a day (QID) | ORAL | Status: DC | PRN
  Administered 2015-08-24: 4 mg via ORAL
  Filled 2015-08-24: qty 1

## 2015-08-24 MED ORDER — SODIUM CHLORIDE 0.9 % IV BOLUS (SEPSIS)
1000.0000 mL | Freq: Once | INTRAVENOUS | Status: AC
  Administered 2015-08-24: 1000 mL via INTRAVENOUS

## 2015-08-24 MED ORDER — LEVOTHYROXINE SODIUM 125 MCG PO TABS
125.0000 ug | ORAL_TABLET | Freq: Every day | ORAL | Status: DC
  Administered 2015-08-24 – 2015-08-27 (×4): 125 ug via ORAL
  Filled 2015-08-24 (×4): qty 1

## 2015-08-24 MED ORDER — SODIUM CHLORIDE 0.9 % IV BOLUS (SEPSIS)
500.0000 mL | Freq: Once | INTRAVENOUS | Status: AC
  Administered 2015-08-24: 500 mL via INTRAVENOUS

## 2015-08-24 MED ORDER — ACETAMINOPHEN 650 MG RE SUPP: 650.0000 mg | Freq: Four times a day (QID) | RECTAL | Status: DC | PRN

## 2015-08-24 MED ORDER — VITAMIN B-12 100 MCG PO TABS
100.0000 ug | ORAL_TABLET | Freq: Every day | ORAL | Status: DC
  Administered 2015-08-24 – 2015-08-26 (×3): 100 ug via ORAL
  Filled 2015-08-24 (×3): qty 1

## 2015-08-24 MED ORDER — ACETAMINOPHEN 325 MG PO TABS: 650.0000 mg | ORAL_TABLET | Freq: Four times a day (QID) | ORAL | Status: DC | PRN

## 2015-08-24 MED ORDER — ONDANSETRON HCL 4 MG/2ML IJ SOLN
4.0000 mg | Freq: Four times a day (QID) | INTRAMUSCULAR | Status: DC | PRN
  Filled 2015-08-24: qty 2

## 2015-08-24 MED ORDER — ADULT MULTIVITAMIN W/MINERALS CH
1.0000 | ORAL_TABLET | Freq: Every day | ORAL | Status: DC
  Administered 2015-08-24 – 2015-08-26 (×3): 1 via ORAL
  Filled 2015-08-24 (×3): qty 1

## 2015-08-24 MED ORDER — HEPARIN SODIUM (PORCINE) 5000 UNIT/ML IJ SOLN
5000.0000 [IU] | Freq: Three times a day (TID) | INTRAMUSCULAR | Status: DC
  Administered 2015-08-24 – 2015-08-27 (×12): 5000 [IU] via SUBCUTANEOUS
  Filled 2015-08-24 (×2): qty 1
  Filled 2015-08-24: qty 2
  Filled 2015-08-24 (×8): qty 1

## 2015-08-24 MED ORDER — MORPHINE SULFATE (PF) 2 MG/ML IV SOLN: 2.0000 mg | INTRAVENOUS | Status: DC | PRN

## 2015-08-24 MED ORDER — SODIUM CHLORIDE 0.9 % IV SOLN
INTRAVENOUS | Status: DC
  Administered 2015-08-24 – 2015-08-26 (×6): via INTRAVENOUS

## 2015-08-24 NOTE — ED Provider Notes (Signed)
[facility] Emergency Department Provider Note  ____________________________________________   I have reviewed the triage vital signs and the nursing notes.   HISTORY  Chief Complaint Diarrhea    HPI Bernard [name] is a 81 y.o. male who presents today complaining of nausea vomiting diarrhea. Patient has multiple different medical problems she is DO NOT RESUSCITATE and DO NOT INTUBATE. He has had an appendectomy and cholecystectomy in the remote past. He has a history of atrial stenosis, CHF. The patientis denying abdominal pain. The family states that they do not think he would survive the surgery would not want him to have one. The patient himself states that he had some nausea vomiting diarrhea today. He is a poor historian. Most the history is in a nursing home. There is no report of bleeding.  Past Medical History  Diagnosis Date  . Hypothyroidism   . Arthritis   . HTN (hypertension)   . Resting tremor     Patient Active Problem List   Diagnosis Date Noted  . PULMONARY FIBROSIS 08/23/2008    Past Surgical History  Procedure Laterality Date  . Appendectomy    . Cholecystectomy      Current Outpatient Rx  Name  Route  Sig  Dispense  Refill  . furosemide (LASIX) 40 MG tablet   Oral   Take 40 mg by mouth daily.         Marland Kitchen levothyroxine (SYNTHROID, LEVOTHROID) 125 MCG tablet   Oral   Take 125 mcg by mouth daily before breakfast.         . Multiple Vitamin (MULTIVITAMIN) tablet   Oral   Take 1 tablet by mouth daily.         . Multiple Vitamins-Minerals (PRESERVISION AREDS PO)   Oral   Take 1 tablet by mouth.         . potassium chloride (KLOR-CON) 8 MEQ tablet   Oral   Take 8 mEq by mouth daily.         . vitamin B-12 (CYANOCOBALAMIN) 100 MCG tablet   Oral   Take 100 mcg by mouth daily.         Marland Kitchen ibuprofen (ADVIL,MOTRIN) 200 MG tablet   Oral   Take 400 mg by mouth every 6 (six) hours as needed for pain or headache.          . levothyroxine (SYNTHROID, LEVOTHROID) 100 MCG tablet   Oral   Take 100 mcg by mouth daily before breakfast.           Allergies Review of patient's allergies indicates no known allergies.  Family History  Problem Relation Age of Onset  . Pneumonia    . Emphysema    . Cancer      Social History Social History  Substance Use Topics  . Smoking status: Former Smoker -- 2.00 packs/day for 25 years    Quit date: 03/02/1970  . Smokeless tobacco: Not on file  . Alcohol Use: No    Review of Systems As limited history secondary to patient dementia, this is what he tells me however  Constitutional: No fever/chills Eyes: No visual changes. ENT: No sore throat. No stiff neck no neck pain Cardiovascular: Denies chest pain. Respiratory: Denies shortness of breath. Gastrointestinal:   Positive vomiting.  Positive diarrhea.  No constipation. Genitourinary: Negative for dysuria. Musculoskeletal: Negative lower extremity swelling Skin: Negative for rash. Neurological: Negative for headaches, focal weakness or numbness. 10-point ROS otherwise negative.  ____________________________________________   PHYSICAL EXAM:  VITAL SIGNS: ED Triage Vitals  Enc Vitals Group     BP 08/23/15 2114 147/107 mmHg     Pulse Rate 08/23/15 2130 98     Resp 08/23/15 2114 15     Temp 08/23/15 2114 99 F (37.2 C)     Temp Source 08/23/15 2114 Rectal     SpO2 08/23/15 2114 96 %     Weight 08/23/15 2114 140 lb (63.504 kg)     Height 08/23/15 2114 6' (1.829 m)     Head Cir --      Peak Flow --      Pain Score --      Pain Loc --      Pain Edu? --      Excl. in [place]? --     Constitutional: Alert and pleasantly demented. Her hectic elderly patient Eyes: Conjunctivae are normal. PERRL. EOMI. Head: Atraumatic. Nose: No congestion/rhinnorhea. Mouth/Throat: Mucous membranes are dry.  Oropharynx non-erythematous. Neck: No stridor.   Nontender with no meningismus Cardiovascular: Normal rate,  regular rhythm. Positive heart murmur, systolic, noted  Respiratory: Normal respiratory effort.  No retractions. Lungs CTAB. Abdominal: Soft and nontender. No distention. No guarding no rebound Back:  There is no focal tenderness or step off there is no midline tenderness there are no lesions noted. there is no CVA tenderness Musculoskeletal: No lower extremity tenderness. No joint effusions, no DVT signs strong distal pulses no edema, feet are cool and mottled but there are faint pulses palpated Neurologic:  Normal speech and language. No gross focal neurologic deficits are appreciated.  Skin:  Skin is warm, dry and intact. No rash noted. Psychiatric: Mood and affect are normal. Speech and behavior are normal.  ____________________________________________   LABS (all labs ordered are listed, but only abnormal results are displayed)  Labs Reviewed  CBC WITH DIFFERENTIAL/PLATELET - Abnormal; Notable for the following:    WBC 12.6 (*)    RBC 3.77 (*)    Hemoglobin 11.8 (*)    HCT 34.2 (*)    RDW 17.9 (*)    Neutro Abs 8.1 (*)    Monocytes Absolute 1.1 (*)    All other components within normal limits  COMPREHENSIVE METABOLIC PANEL - Abnormal; Notable for the following:    CO2 21 (*)    Glucose, Bld 153 (*)    BUN 59 (*)    Creatinine, Ser 2.81 (*)    AST 74 (*)    Total Bilirubin 3.5 (*)    GFR calc non Af Amer 18 (*)    GFR calc Af Amer 21 (*)    All other components within normal limits  PROTIME-INR - Abnormal; Notable for the following:    Prothrombin Time 18.2 (*)    All other components within normal limits  LACTIC ACID, PLASMA  LIPASE, BLOOD  URINALYSIS COMPLETEWITH MICROSCOPIC ([facility] ONLY)  LACTIC ACID, PLASMA   ____________________________________________  EKG  I personally interpreted any EKGs ordered by me or triage  ____________________________________________  RADIOLOGY  I reviewed any imaging ordered by me or triage that were performed during my  shift ____________________________________________   PROCEDURES  Procedure(s) performed: None  Critical Care performed: None  ____________________________________________   INITIAL IMPRESSION / ASSESSMENT AND PLAN / ED COURSE  Pertinent labs & imaging results that were available during my care of the patient were reviewed by me and considered in my medical decision making (see chart for details).  Elderly gentleman with no acute distress noted with significant past medical history, who  has had nausea vomiting diarrhea today. Blood work is noted, patient is significant way dehydrated clinically as well as by lab work. IV fluid is been given gingerly given his history of heart failure and our inability to intubate him if we give him too much. He is sleeping comfortably, serial abdominal exams show no acute surgical pathology, and even if there were, family would not consent to surgery. This reason a CT scan was obtained. Patient will be admitted for hydration and reevaluation by hospitalist ____________________________________________   FINAL CLINICAL IMPRESSION(S) / ED DIAGNOSES  Final diagnoses:  None     Schuyler Amor, MD 08/24/15 [phone]

## 2015-08-24 NOTE — Progress Notes (Signed)
[facility] at [place] NAME: Bernard [name]    MR#:  [id number]  DATE OF BIRTH:  1918/11/18  SUBJECTIVE:  CHIEF COMPLAINT:  Patient is demented. Poor historian, granddaughter is at bedside providing history  REVIEW OF SYSTEMS:  Review of systems unobtainable as patient is demented  DRUG ALLERGIES:  No Known Allergies  VITALS:  Blood pressure 78/58, pulse 96, temperature 98.3 F (36.8 C), temperature source Oral, resp. rate 17, height 6' (1.829 m), weight 60.691 kg (133 lb 12.8 oz), SpO2 100 %.  PHYSICAL EXAMINATION:  GENERAL:  81 y.o.-year-old patient lying in the bed with no acute distress.  EYES: Pupils equal, round, reactive to light and accommodation. No scleral icterus.  HEENT: Head atraumatic, normocephalic. Oropharynx and nasopharynx clear. Dry mucous membranes NECK:  Supple, no jugular venous distention. No thyroid enlargement, no tenderness.  LUNGS: Normal breath sounds bilaterally, no wheezing, rales,rhonchi or crepitation. No use of accessory muscles of respiration.  CARDIOVASCULAR: S1, S2 normal. No murmurs, rubs, or gallops.  ABDOMEN: Soft, nontender, nondistended. Bowel sounds present. No organomegaly or mass.  EXTREMITIES: No pedal edema, cyanosis, or clubbing.  NEUROLOGIC: Patient with chronic dementia and doesn't follow verbal commands, PSYCHIATRIC: The patient is with chronic baseline dementia  SKIN: No obvious rash, lesion, or ulcer.    LABORATORY PANEL:   CBC  Recent Labs Lab 08/24/15 0223  WBC 10.6  HGB 10.5*  HCT 30.3*  PLT 196   ------------------------------------------------------------------------------------------------------------------  Chemistries   Recent Labs Lab 08/23/15 2222 08/24/15 0223  NA 137 140  K 4.7 4.5  CL 104 108  CO2 21* 23  GLUCOSE 153* 98  BUN 59* 58*  CREATININE 2.81* 2.66*  CALCIUM 9.5 9.2  AST 74*  --   ALT 20  --   ALKPHOS 78  --   BILITOT 3.5*  --     ------------------------------------------------------------------------------------------------------------------  Cardiac Enzymes No results for input(s): TROPONINI in the last 168 hours. ------------------------------------------------------------------------------------------------------------------  RADIOLOGY:  US Renal  08/24/2015  CLINICAL DATA:  Acute kidney injury. EXAM: RENAL / URINARY TRACT ULTRASOUND COMPLETE COMPARISON:  None. FINDINGS: Right Kidney: Length: 9.1 cm. Mildly echogenic right kidney. No right hydronephrosis. Limited visualization of a 0.7 cm simple appearing cyst in the upper right kidney. Left Kidney: Length: 11.7 cm. Mildly echogenic left kidney. No left hydronephrosis. Simple 2.4 x 2.2 x 1.7 cm renal cyst in the interpolar left kidney. Bladder: The bladder wall appears diffusely mildly thickened and trabeculated. There is a nonspecific 1.8 x 1.5 x 2.2 cm polypoid lesion in the posterior midline bladder wall. The prostate is markedly enlarged, with approximate prostate measurements of 9.1 x 6.9 x 7.8 cm, for an approximate prostate volume of 258 cc. IMPRESSION: 1. No hydronephrosis. 2. Mildly echogenic kidneys, a nonspecific finding indicating renal parenchymal disease of uncertain chronicity. 3. Bilateral benign-appearing renal cysts. 4. Markedly enlarged prostate. Diffusely mildly thickened and trabeculated bladder wall, suggesting chronic bladder outlet obstruction. 5. Nonspecific 1.8 x 1.5 x 2.2 cm polypoid structure in the posterior midline bladder wall, cannot exclude a primary bladder neoplasm. Urology consultation and correlation with urinalysis and cystoscopy is advised. Electronically Signed   By: Ilona Sorrel M.D.   On: 08/24/2015 13:25    EKG:  No orders found for this or any previous visit.  ASSESSMENT AND PLAN:   81 year old Caucasian gentleman history of dementia as well as essential hypertension presenting after nausea vomiting abdominal pain 1. Acute  kidney injury, likely prerenal from dehydration  leading to acute tubular necrosis :  Will provide IV fluid bolus and continue IV fluid hydration and follow urine output and renal function  check urine urea and urine creatinine renal ultrasound with no hydronephrosis but has revealed bladder polyp   avoid further nephrotoxic agents  if no improvement can consult nephrology family does not wish to have aggressive care  #Bladder polyp ? Concern for bladder neoplasm 1.8 x 1.5 x 2.2 cm polypoid structure in the posterior midline bladder wall, cannot exclude a primary bladder Neoplasm Consult urology regarding recommendations.  #  intractable nausea, vomiting and diarrhea probably viral etiology  improving clinically Continue symptomatically treatment    #. Peripheral arterial disease: Unknown duration will consult vascular surgery for further recommendations maintain adequate blood pressure  # Essential hypertension: Hold diuretics  4. Hypothyroidism unspecified: Synthroid 5. Venous thromboembolism prophylactic: Heparin subcutaneous     All the records are reviewed and case discussed with Care Management/Social Workerr. Management plans discussed with the patient, family and they are in agreement.  CODE STATUS: DNR  TOTAL TIME TAKING CARE OF THIS PATIENT: 35  minutes.   POSSIBLE D/C IN 2-3  DAYS, DEPENDING ON CLINICAL CONDITION.   Nicholes Mango M.D on 08/24/2015 at 3:30 PM  Between 7am to 6pm - Pager - [phone] After 6pm go to [email or web address] - password [email or web address] [facility]  [facility]  Office  [phone]  CC: Primary care physician; No primary care provider on file.

## 2015-08-24 NOTE — Progress Notes (Signed)
Patient is admitted to room 224 with a diagnosis of AKD. Alert and oriented x 2. Patient is oriented to his room, staff and call bell/ascom. Denied any acute pain, no respiratory distress noted.   Skin assessment done with   Dawn RN. Noted cool mottled bilateral feet, redness to the sacrum, bilateral groin, and scattered bruises.  Patient  Had one emesis x 1 and received Zofran 4mg  oral  Since there was no IV site at that time.  Antiemetic was effective. Will continue to monitor.

## 2015-08-24 NOTE — H&P (Addendum)
[facility] at [place] NAME: Gwyn Lehne    MR#:  [id number]  DATE OF BIRTH:  10-Aug-1919   DATE OF ADMISSION:  08/23/2015  PRIMARY CARE PHYSICIAN: No primary care provider on file.   REQUESTING/REFERRING PHYSICIAN: McShane  CHIEF COMPLAINT:   Chief Complaint  Patient presents with  . Diarrhea    Pt. arrived via EMS from "the [place]" for Nausea, vomitting and diarrhea.    HISTORY OF PRESENT ILLNESS:  Heywood Dabish  is a 81 y.o. male with a known history of dementia, essential hypertension presenting with poor by mouth intake as well as nausea vomiting. Patient unable to provide meaningful information given mental status/medical condition history obtained through family members present at bedside. They state for approximately 5 weeks he is had poor oral intake since he was moved into an assisted living facility. Over the last 2 days he's had intermittent abdominal pain which is unable to further quantify or qualify as well as an episode of nausea and vomiting after eating lunch. At baseline he is markedly confused, can ambulate however usually refuses to do so.  PAST MEDICAL HISTORY:   Past Medical History  Diagnosis Date  . Hypothyroidism   . Arthritis   . HTN (hypertension)   . Resting tremor     PAST SURGICAL HISTORY:   Past Surgical History  Procedure Laterality Date  . Appendectomy    . Cholecystectomy      SOCIAL HISTORY:   Social History  Substance Use Topics  . Smoking status: Former Smoker -- 2.00 packs/day for 25 years    Quit date: 03/02/1970  . Smokeless tobacco: Not on file  . Alcohol Use: No    FAMILY HISTORY:   Family History  Problem Relation Age of Onset  . Pneumonia    . Emphysema    . Cancer      DRUG ALLERGIES:  No Known Allergies  REVIEW OF SYSTEMS:  Unable to obtain given patient's mental status/medical condition   MEDICATIONS AT HOME:   Prior to Admission medications    Medication Sig Start Date End Date Taking? Authorizing Provider  furosemide (LASIX) 40 MG tablet Take 40 mg by mouth daily.   Yes Historical Provider, MD  ibuprofen (ADVIL,MOTRIN) 200 MG tablet Take 400 mg by mouth every 6 (six) hours as needed for pain or headache.   Yes Historical Provider, MD  levothyroxine (SYNTHROID, LEVOTHROID) 125 MCG tablet Take 125 mcg by mouth daily before breakfast.   Yes Historical Provider, MD  Multiple Vitamin (MULTIVITAMIN) tablet Take 1 tablet by mouth daily.   Yes Historical Provider, MD  Multiple Vitamins-Minerals (PRESERVISION AREDS PO) Take 1 tablet by mouth.   Yes Historical Provider, MD  potassium chloride (KLOR-CON) 8 MEQ tablet Take 8 mEq by mouth daily.   Yes Historical Provider, MD  vitamin B-12 (CYANOCOBALAMIN) 100 MCG tablet Take 100 mcg by mouth daily.   Yes Historical Provider, MD      VITAL SIGNS:  Blood pressure 119/58, pulse 113, temperature 99 F (37.2 C), temperature source Rectal, resp. rate 17, height 6' (1.829 m), weight 140 lb (63.504 kg), SpO2 97 %.  PHYSICAL EXAMINATION:  VITAL SIGNS: Filed Vitals:   08/24/15 0000 08/24/15 0030  BP: 106/70 119/58  Pulse: 114 113  Temp:    Resp: 15 17   GENERAL:81 y.o.male currently in no acute distress.  HEAD: Normocephalic, atraumatic.  EYES: Pupils equal, round, reactive to light. Extraocular muscles intact. No scleral  icterus.  MOUTH: Dry mucosal membrane. Dentition intact. No abscess noted.  EAR, NOSE, THROAT: Clear without exudates. No external lesions.  NECK: Supple. No thyromegaly. No nodules. No JVD.  PULMONARY: Clear to ascultation, without wheeze rails or rhonci. No use of accessory muscles, Good respiratory effort. good air entry bilaterally CHEST: Nontender to palpation.  CARDIOVASCULAR: S1 and S2. Regular rate and rhythm. No murmurs, rubs, or gallops. No edema. Popliteal pulses 2+ DP pulse 1+ nonpalpable pedal pulses bilaterally.  GASTROINTESTINAL: Soft, nontender, nondistended.  No masses. Positive bowel sounds. No hepatosplenomegaly.  MUSCULOSKELETAL: No swelling, clubbing, or edema. Range of motion full in all extremities.  NEUROLOGIC: Cranial nerves II through XII are intact. No gross focal neurological deficits. Sensation intact. Reflexes intact.  SKIN: Bilateral feet with mottling, cool to touch, appears sensation is intact otherwise No ulceration, lesions, rashes, or cyanosis. Skin warm and dry. Turgor intact.  PSYCHIATRIC: Mood, affect blunted. The patient is awake, alert and oriented x  self Insight, judgment poor.    LABORATORY PANEL:   CBC  Recent Labs Lab 08/23/15 2122  WBC 12.6*  HGB 11.8*  HCT 34.2*  PLT 275   ------------------------------------------------------------------------------------------------------------------  Chemistries   Recent Labs Lab 08/23/15 2222  NA 137  K 4.7  CL 104  CO2 21*  GLUCOSE 153*  BUN 59*  CREATININE 2.81*  CALCIUM 9.5  AST 74*  ALT 20  ALKPHOS 78  BILITOT 3.5*   ------------------------------------------------------------------------------------------------------------------  Cardiac Enzymes No results for input(s): TROPONINI in the last 168 hours. ------------------------------------------------------------------------------------------------------------------  RADIOLOGY:  No results found.  EKG:  No orders found for this or any previous visit.  IMPRESSION AND PLAN:   81 year old Caucasian gentleman history of dementia as well as essential hypertension presenting after nausea vomiting abdominal pain 1. Acute kidney injury, likely acute tubular necrosis: IV fluid hydration and follow urine output and renal function check urine urea and urine creatinine, renal ultrasound avoid further nephrotoxic agents if no improvement can consult nephrology family does not wish to have aggressive care 2. Peripheral arterial disease: Unknown duration will consult vascular surgery for further  recommendations maintain adequate blood pressure 3. Essential hypertension: Hold diuretics 4. Hypothyroidism unspecified: Synthroid 5. Venous thromboembolism prophylactic: Heparin subcutaneous   All the records are reviewed and case discussed with ED provider. Management plans discussed with the patient, family and they are in agreement.  CODE STATUS: DO NOT RESUSCITATE  TOTAL TIME TAKING CARE OF THIS PATIENT: 45 minutes.    Ann Groeneveld,  Karenann Cai.D on 08/24/2015 at 1:24 AM  Between 7am to 6pm - Pager - [phone]  After 6pm: House Pager: - [facility]  Office  [phone]  CC: Primary care physician; No primary care provider on file.

## 2015-08-24 NOTE — Progress Notes (Signed)
Dr. Margaretmary Eddy informed that pt BP slightly responded to the administration of the 1L NS Bolus but still pt still remains hypotensive (See VS Flow Sheet).   No new orders given.  Will continue to monitor and notify MD of any changes.  Pt currently resting comfortably w/bed in low and locked position, bed alarm on and call bell in place.

## 2015-08-24 NOTE — Progress Notes (Signed)
Attempted to feed pt breakfast.  Pt tried eggs, hash-browned potatoes, and oatmeal but spit it out stating he could not chew it and that it didn't taste good.  I offered to get pt some yogurt and grits but pt declined.  I gave pt applesauce and he took about 5-6 bites and stated he no longer wanted to eat because it made his stomach hurt.    Pt seems to take in fluids better than solids.    Will contact dietary for nutritional consult and continue to monitor.

## 2015-08-24 NOTE — Progress Notes (Signed)
Reported manual BP of 74/58 HR 96 to Dr. Margaretmary Eddy.  Orders given to administer x1L Bolus NS.  Will follow through w/orders and notify MD of any changes.

## 2015-08-25 DIAGNOSIS — E43 Unspecified severe protein-calorie malnutrition: Secondary | ICD-10-CM | POA: Insufficient documentation

## 2015-08-25 LAB — CBC
HEMATOCRIT: 24.3 % — AB (ref 40.0–52.0)
HEMOGLOBIN: 8.2 g/dL — AB (ref 13.0–18.0)
MCH: 31.3 pg (ref 26.0–34.0)
MCHC: 33.6 g/dL (ref 32.0–36.0)
MCV: 93.1 fL (ref 80.0–100.0)
Platelets: 128 10*3/uL — ABNORMAL LOW (ref 150–440)
RBC: 2.61 MIL/uL — AB (ref 4.40–5.90)
RDW: 18.5 % — ABNORMAL HIGH (ref 11.5–14.5)
WBC: 3.7 10*3/uL — ABNORMAL LOW (ref 3.8–10.6)

## 2015-08-25 LAB — BASIC METABOLIC PANEL
ANION GAP: 6 (ref 5–15)
BUN: 53 mg/dL — ABNORMAL HIGH (ref 6–20)
CALCIUM: 8.2 mg/dL — AB (ref 8.9–10.3)
CO2: 21 mmol/L — AB (ref 22–32)
Chloride: 116 mmol/L — ABNORMAL HIGH (ref 101–111)
Creatinine, Ser: 2.18 mg/dL — ABNORMAL HIGH (ref 0.61–1.24)
GFR, EST AFRICAN AMERICAN: 28 mL/min — AB (ref >60)
GFR, EST NON AFRICAN AMERICAN: 24 mL/min — AB (ref >60)
GLUCOSE: 89 mg/dL (ref 65–99)
POTASSIUM: 3.8 mmol/L (ref 3.5–5.1)
Sodium: 143 mmol/L (ref 135–145)

## 2015-08-25 LAB — MRSA PCR SCREENING: MRSA BY PCR: NEGATIVE

## 2015-08-25 MED ORDER — ENSURE ENLIVE PO LIQD
237.0000 mL | Freq: Two times a day (BID) | ORAL | Status: DC
  Administered 2015-08-25 – 2015-08-27 (×5): 237 mL via ORAL

## 2015-08-25 MED ORDER — HALOPERIDOL LACTATE 5 MG/ML IJ SOLN: 1.0000 mg | Freq: Once | INTRAMUSCULAR | Status: DC | PRN

## 2015-08-25 NOTE — Progress Notes (Signed)
Patient agitated and confused. Impulsively getting up and attempting to hit staff members. IV pulled out. New one inserted. Dr. Margaretmary Eddy Notified of actions. 1 mg Haldol x1 IV push per Dr. Margaretmary Eddy.  Almedia Balls, RN

## 2015-08-25 NOTE — Progress Notes (Signed)
[place] at [place] NAME: Bernard [name]    MR#:  [id number]  DATE OF BIRTH:  07/19/1919  SUBJECTIVE:  CHIEF COMPLAINT:  Patient is demented at his baseline. Poor historian, daughter is at bedside providing history. Pt is agitated today in the late afternoon.  REVIEW OF SYSTEMS:  Review of systems unobtainable as patient is demented  DRUG ALLERGIES:  No Known Allergies  VITALS:  Blood pressure 81/45, pulse 76, temperature 98.6 F (37 C), temperature source Oral, resp. rate 16, height 6' (1.829 m), weight 60.691 kg (133 lb 12.8 oz), SpO2 100 %.  PHYSICAL EXAMINATION:  GENERAL:  81 y.o.-year-old patient lying in the bed with no acute distress.  EYES: Pupils equal, round, reactive to light and accommodation. No scleral icterus.  HEENT: Head atraumatic, normocephalic. Oropharynx and nasopharynx clear. Dry mucous membranes NECK:  Supple, no jugular venous distention. No thyroid enlargement, no tenderness.  LUNGS: Normal breath sounds bilaterally, no wheezing, rales,rhonchi or crepitation. No use of accessory muscles of respiration.  CARDIOVASCULAR: S1, S2 normal. No murmurs, rubs, or gallops.  ABDOMEN: Soft, nontender, nondistended. Bowel sounds present. No organomegaly or mass.  EXTREMITIES: No pedal edema, cyanosis, or clubbing.  NEUROLOGIC: Patient with chronic dementia and doesn't follow verbal commands, PSYCHIATRIC: The patient is with chronic baseline dementia  SKIN: No obvious rash, lesion, or ulcer.    LABORATORY PANEL:   CBC  Recent Labs Lab 08/25/15 0644  WBC 3.7*  HGB 8.2*  HCT 24.3*  PLT 128*   ------------------------------------------------------------------------------------------------------------------  Chemistries   Recent Labs Lab 08/23/15 2222  08/25/15 0644  NA 137  < > 143  K 4.7  < > 3.8  CL 104  < > 116*  CO2 21*  < > 21*  GLUCOSE 153*  < > 89  BUN 59*  < > 53*  CREATININE 2.81*  < > 2.18*   CALCIUM 9.5  < > 8.2*  AST 74*  --   --   ALT 20  --   --   ALKPHOS 78  --   --   BILITOT 3.5*  --   --   < > = values in this interval not displayed. ------------------------------------------------------------------------------------------------------------------  Cardiac Enzymes No results for input(s): TROPONINI in the last 168 hours. ------------------------------------------------------------------------------------------------------------------  RADIOLOGY:  US Renal  08/24/2015  CLINICAL DATA:  Acute kidney injury. EXAM: RENAL / URINARY TRACT ULTRASOUND COMPLETE COMPARISON:  None. FINDINGS: Right Kidney: Length: 9.1 cm. Mildly echogenic right kidney. No right hydronephrosis. Limited visualization of a 0.7 cm simple appearing cyst in the upper right kidney. Left Kidney: Length: 11.7 cm. Mildly echogenic left kidney. No left hydronephrosis. Simple 2.4 x 2.2 x 1.7 cm renal cyst in the interpolar left kidney. Bladder: The bladder wall appears diffusely mildly thickened and trabeculated. There is a nonspecific 1.8 x 1.5 x 2.2 cm polypoid lesion in the posterior midline bladder wall. The prostate is markedly enlarged, with approximate prostate measurements of 9.1 x 6.9 x 7.8 cm, for an approximate prostate volume of 258 cc. IMPRESSION: 1. No hydronephrosis. 2. Mildly echogenic kidneys, a nonspecific finding indicating renal parenchymal disease of uncertain chronicity. 3. Bilateral benign-appearing renal cysts. 4. Markedly enlarged prostate. Diffusely mildly thickened and trabeculated bladder wall, suggesting chronic bladder outlet obstruction. 5. Nonspecific 1.8 x 1.5 x 2.2 cm polypoid structure in the posterior midline bladder wall, cannot exclude a primary bladder neoplasm. Urology consultation and correlation with urinalysis and cystoscopy is advised. Electronically Signed  By: Ilona Sorrel M.D.   On: 08/24/2015 13:25    EKG:  No orders found for this or any previous visit.  ASSESSMENT AND  PLAN:   81 year old Caucasian gentleman history of dementia as well as essential hypertension presenting after nausea vomiting abdominal pain 1. Acute kidney injury, likely prerenal from dehydration leading to acute tubular necrosis :  Provided  IV fluid bolus and continue IV fluid hydration and follow urine output and renal function  creatinine is improving - 2.81-->2.66-->2.18 renal ultrasound with no hydronephrosis but has revealed bladder polyp  avoid further nephrotoxic agents  if no improvement can consult nephrology family does not wish to have aggressive care  #Bladder polyp ? Concern for bladder neoplasm 1.8 x 1.5 x 2.2 cm polypoid structure in the posterior midline bladder wall, cannot exclude a primary bladder Neoplasm Consult urology regarding recommendations. Family is not considering  Aggressive measures though  #  intractable nausea, vomiting and diarrhea probably viral etiology  improving clinically Continue symptomatically treatment    #. Peripheral arterial disease: Unknown duration will consult vascular surgery for further recommendations maintain adequate blood pressure  # Essential hypertension: Hold diuretics  4. Hypothyroidism unspecified: Synthroid 5. Venous thromboembolism prophylactic: Heparin subcutaneous     All the records are reviewed and case discussed with Care Management/Social Workerr. Management plans discussed with the patient, family and they are in agreement.  CODE STATUS: DNR  TOTAL TIME TAKING CARE OF THIS PATIENT: 35  minutes.   POSSIBLE D/C IN 2-3  DAYS, DEPENDING ON CLINICAL CONDITION.   Nicholes Mango M.D on 08/25/2015 at 9:40 PM  Between 7am to 6pm - Pager - [phone] After 6pm go to [email or web address] - password [email or web address] [facility]  [facility]  Office  [phone]  CC: Primary care physician; No primary care provider on file.

## 2015-08-25 NOTE — Progress Notes (Signed)
Initial Nutrition Assessment  DOCUMENTATION CODES:   Severe malnutrition in context of chronic illness  INTERVENTION:  Meals and snacks: Cater to pt preferences, recommend dysphagia III diet (chopped foods) as pt with limited teeth Medical Nutrition Supplement Therapy: Recommend Ensure Enlive po BID, each supplement provides 350 kcal and 20 grams of protein Coordination of care: Discussed with RN, Raquel Sarna pt compliant of wanting to clean mouth and lips    NUTRITION DIAGNOSIS:   Biting/chewing difficulty related to poor appetite, inability to eat as evidenced by meal completion < 25%.    GOAL:   Patient will meet greater than or equal to 90% of their needs    MONITOR:    (Energy intake, Electrolyte and renal profile, Digestive system)  REASON FOR ASSESSMENT:   Consult Poor PO  ASSESSMENT:      Pt admitted with acute kidney injury, dehydration, bladder neoplasm/polyp, nausea, vomiting diarrhea prior to admission possible viral illness per MD note  Past Medical History  Diagnosis Date  . Hypothyroidism   . Arthritis   . HTN (hypertension)   . Resting tremor     Current Nutrition: pt reports did not eat breakfast because a wants to clean teeth and lips (noted lips dry, flaky)  Food/Nutrition-Related History: pt unable to provide history of intake.  Did report intake has been down for "awhile"   Scheduled Medications:  . feeding supplement (ENSURE ENLIVE)  237 mL Oral BID BM  . heparin  5,000 Units Subcutaneous 3 times per day  . levothyroxine  125 mcg Oral QAC breakfast  . multivitamin with minerals  1 tablet Oral Daily  . vitamin B-12  100 mcg Oral Daily    Continuous Medications:  . sodium chloride 125 mL/hr at 08/25/15 0554     Electrolyte/Renal Profile and Glucose Profile:   Recent Labs Lab 08/23/15 2222 08/24/15 0223 08/25/15 0644  NA 137 140 143  K 4.7 4.5 3.8  CL 104 108 116*  CO2 21* 23 21*  BUN 59* 58* 53*  CREATININE 2.81* 2.66* 2.18*   CALCIUM 9.5 9.2 8.2*  GLUCOSE 153* 98 89   Protein Profile:   Recent Labs Lab 08/23/15 2222  ALBUMIN 3.9    Gastrointestinal Profile: Last BM:12/04   Nutrition-Focused Physical Exam Findings: Nutrition-Focused physical exam completed. Findings are moderate fat depletion, severe-moderate muscle depletion, and no edema.      Weight Change: noted wt loss of 27% in the last 7 years Per wt encounters  Diet Order:  DIET DYS 3 Room service appropriate?: Yes; Fluid consistency:: Thin  Skin:   reviewed      Height:   Ht Readings from Last 1 Encounters:  08/23/15 6' (1.829 m)    Weight:   Wt Readings from Last 1 Encounters:  08/24/15 133 lb 12.8 oz (60.691 kg)    Ideal Body Weight:     BMI:  Body mass index is 18.14 kg/(m^2).  Estimated Nutritional Needs:   Kcal:  BEE 1488 kcals (IF 1.0-1.3, AF1.3) (Using IBW of 81 [phone] kcals/d  Protein:  (1.0-1.2 g/kg) 81-97 g/d  Fluid:  (25-50ml/kg) 2025-2419ml/d  EDUCATION NEEDS:   No education needs identified at this time  HIGH Care Level  Marius Betts B. Zenia Resides, [place], [place] (pager)

## 2015-08-25 NOTE — Progress Notes (Signed)
PT Cancellation Note  Patient Details Name: Bernard [name] MRN: [id number] DOB: 10/27/1918   Cancelled Treatment:    Reason Eval/Treat Not Completed: Medical issues which prohibited therapy (Still quite hypotensive and not eating yet).  Will follow along to assess when pt a bit more stable and tolerating food.   Ramond Dial 08/25/2015, 1:47 PM   Mee Hives, PT MS Acute Rehab Dept. Number: [facility] [phone] and [place] [phone]

## 2015-08-25 NOTE — Plan of Care (Signed)
Problem: Education: Goal: Knowledge of [facility] Education information/materials will improve Outcome: Not Progressing Pt is confused     Problem: Health Behavior/Discharge Planning: Goal: Ability to manage health-related needs will improve Outcome: Not Progressing Pt is confused

## 2015-08-26 ENCOUNTER — Inpatient Hospital Stay: Payer: Medicare Other

## 2015-08-26 DIAGNOSIS — N189 Chronic kidney disease, unspecified: Secondary | ICD-10-CM

## 2015-08-26 DIAGNOSIS — D649 Anemia, unspecified: Secondary | ICD-10-CM

## 2015-08-26 DIAGNOSIS — R161 Splenomegaly, not elsewhere classified: Secondary | ICD-10-CM

## 2015-08-26 DIAGNOSIS — E860 Dehydration: Secondary | ICD-10-CM

## 2015-08-26 DIAGNOSIS — I5032 Chronic diastolic (congestive) heart failure: Secondary | ICD-10-CM

## 2015-08-26 DIAGNOSIS — R109 Unspecified abdominal pain: Secondary | ICD-10-CM

## 2015-08-26 DIAGNOSIS — F17210 Nicotine dependence, cigarettes, uncomplicated: Secondary | ICD-10-CM

## 2015-08-26 DIAGNOSIS — Z66 Do not resuscitate: Secondary | ICD-10-CM

## 2015-08-26 DIAGNOSIS — R748 Abnormal levels of other serum enzymes: Secondary | ICD-10-CM

## 2015-08-26 DIAGNOSIS — N179 Acute kidney failure, unspecified: Secondary | ICD-10-CM

## 2015-08-26 DIAGNOSIS — E538 Deficiency of other specified B group vitamins: Secondary | ICD-10-CM

## 2015-08-26 DIAGNOSIS — K7689 Other specified diseases of liver: Secondary | ICD-10-CM

## 2015-08-26 DIAGNOSIS — I272 Other secondary pulmonary hypertension: Secondary | ICD-10-CM

## 2015-08-26 DIAGNOSIS — N400 Enlarged prostate without lower urinary tract symptoms: Secondary | ICD-10-CM

## 2015-08-26 DIAGNOSIS — J8410 Pulmonary fibrosis, unspecified: Secondary | ICD-10-CM

## 2015-08-26 DIAGNOSIS — E872 Acidosis: Secondary | ICD-10-CM

## 2015-08-26 DIAGNOSIS — F0390 Unspecified dementia without behavioral disturbance: Secondary | ICD-10-CM

## 2015-08-26 DIAGNOSIS — M129 Arthropathy, unspecified: Secondary | ICD-10-CM

## 2015-08-26 DIAGNOSIS — I739 Peripheral vascular disease, unspecified: Secondary | ICD-10-CM

## 2015-08-26 DIAGNOSIS — I2510 Atherosclerotic heart disease of native coronary artery without angina pectoris: Secondary | ICD-10-CM

## 2015-08-26 DIAGNOSIS — I10 Essential (primary) hypertension: Secondary | ICD-10-CM

## 2015-08-26 DIAGNOSIS — R630 Anorexia: Secondary | ICD-10-CM

## 2015-08-26 DIAGNOSIS — I350 Nonrheumatic aortic (valve) stenosis: Secondary | ICD-10-CM

## 2015-08-26 DIAGNOSIS — J439 Emphysema, unspecified: Secondary | ICD-10-CM

## 2015-08-26 DIAGNOSIS — R251 Tremor, unspecified: Secondary | ICD-10-CM

## 2015-08-26 DIAGNOSIS — Z515 Encounter for palliative care: Secondary | ICD-10-CM

## 2015-08-26 LAB — BASIC METABOLIC PANEL
ANION GAP: 6 (ref 5–15)
BUN: 41 mg/dL — ABNORMAL HIGH (ref 6–20)
CHLORIDE: 118 mmol/L — AB (ref 101–111)
CO2: 19 mmol/L — AB (ref 22–32)
Calcium: 7.9 mg/dL — ABNORMAL LOW (ref 8.9–10.3)
Creatinine, Ser: 1.92 mg/dL — ABNORMAL HIGH (ref 0.61–1.24)
GFR calc non Af Amer: 28 mL/min — ABNORMAL LOW (ref >60)
GFR, EST AFRICAN AMERICAN: 33 mL/min — AB (ref >60)
GLUCOSE: 73 mg/dL (ref 65–99)
Potassium: 3.4 mmol/L — ABNORMAL LOW (ref 3.5–5.1)
Sodium: 143 mmol/L (ref 135–145)

## 2015-08-26 LAB — CBC
HCT: 20.5 % — ABNORMAL LOW (ref 40.0–52.0)
HEMOGLOBIN: 7.1 g/dL — AB (ref 13.0–18.0)
MCH: 32.3 pg (ref 26.0–34.0)
MCHC: 34.7 g/dL (ref 32.0–36.0)
MCV: 92.8 fL (ref 80.0–100.0)
Platelets: 111 10*3/uL — ABNORMAL LOW (ref 150–440)
RBC: 2.21 MIL/uL — AB (ref 4.40–5.90)
RDW: 18.6 % — ABNORMAL HIGH (ref 11.5–14.5)
WBC: 3.1 10*3/uL — ABNORMAL LOW (ref 3.8–10.6)

## 2015-08-26 LAB — ABO/RH: ABO/RH(D): A NEG

## 2015-08-26 LAB — PREPARE RBC (CROSSMATCH)

## 2015-08-26 MED ORDER — PANTOPRAZOLE SODIUM 40 MG PO TBEC
40.0000 mg | DELAYED_RELEASE_TABLET | Freq: Every day | ORAL | Status: DC
  Administered 2015-08-27: 40 mg via ORAL
  Filled 2015-08-26: qty 1

## 2015-08-26 MED ORDER — CYANOCOBALAMIN 500 MCG PO TABS
500.0000 ug | ORAL_TABLET | Freq: Every day | ORAL | Status: DC
  Administered 2015-08-27: 500 ug via ORAL
  Filled 2015-08-26: qty 1

## 2015-08-26 MED ORDER — SODIUM CHLORIDE 0.9 % IV SOLN
Freq: Once | INTRAVENOUS | Status: AC
  Administered 2015-08-26: 16:00:00 via INTRAVENOUS

## 2015-08-26 NOTE — Plan of Care (Signed)
Pt still refusing all food except for a few bites. Is determined to clear mouth of all food even by using a paper towel.  Pt complaining that mouth is sore.  RN placed moisturizer on lips and clean out mouth + mouthwash.  Pt will only drink water consistently.

## 2015-08-26 NOTE — Consult Note (Addendum)
Palliative Medicine Inpatient Consult Note   Name: Bernard [name] Date: 08/26/2015 MRN: [id number]  DOB: [birth year]  Referring Physician: Nicholes Mango, MD  Palliative Care consult requested for this 81 y.o. male for goals of medical therapy in patient with Severe Anemia, Abdominal Pain, and very, very poor oral intake.    ------------------------------------------------------------------------------  TODAY'S DISCUSSIONS AND DECISIONS: 1.  Pt is DNR.  I placed a DNR portable form in his paper chart.  2.  Pt cannot make his own health care decisions.   Pt's daughter is POA.  There is a medical clause in this POA document.  Thus she is Media planner when patient cannot make his own decisions (if there were an actual [place], then that person would have decision-making that would supercede the decisions of the POA, but there is not an actual known HCPOA, so this does not apply).  Ihave placed the pertinent copies of the POA form in pts paper chart (the entire document is not necessary).  3.  Pt is suffering from a number of illnesses and would probably benefit from hospice care at some point in the near future.  He will likely meet Hospice criteria in terms of a terminal diagnosis due to heart and lung disease --but I will need to obtain medical records from Dr. Hulan Fess tomorrow.    4.  Pt does not meet hospice criteria for the diagnosis of dementia or malnutrition.  But, he has other diagnoses we need more records on and these will likely be more than adequate to qualify him for hospice care.  If his PSA is very high, then he might have prostate cancer (and not just BPH) and family would not want him to undergo any treatments for this as he would not tolerate it.  He may need a foley at some point if his urine output is not good and this should be monitored by nursing staff (in a SNF setting).   5.  Despite being appropriate for Hospice at some point in the near future, he  also MAY be appropriate for short term rehab (with a Palliative Care Consult which would then transition to Hospice at the facility (probably SNF).  I see that PT has recommended SNF (he would probably have limited short term rehab days but might benefit).   6.  We discussed level of care and the fact that it is possible that pt may need to be in a SNF vs his current ALF level of care. But that is not definite and can be discussed tomorrow with Social Worker/ Care Mgr.    7.  Pt has Medicare and VA benefits and Social Security. Not eligible for Medicaid but has no other sources of income per family.  So their options for spending more money out of pocket are limited and may limit where he lives going forward.  8.  None of the above matters if he doesn't eat more.  He apparently has only eaten three things for years:  Frosted Flakes cereal, Vanilla Wafers, and Sausage Biscuits.  He hasn't eaten any of these items as brought in by family since admission.  We discussed an ST eval and family requests this. I will discuss having this ordered with pts attending.  9.  Pt should be on a PPI for comfort --discussed with family.  NO GI Work up or procedures are desired, but a PPI RX is desired as an empiric RX that might reduce GERD/ PUD symptoms.  The  family is not in favor of meds that are not providing comfort.     IMPRESSION: Anemia as part of PANCYTOPENIA  ---associated with abdominal pain, nausea, vomiting, and diarrhea Dehydration ---now volume repleted Acute on chronic renal failure  ---CKD stage is not known ---due to dehydration (possible ATN) Aortic Stenosis --thought to be severe  ---details to be obtained from Dr. Hulan Fess at [facility] office      [phone]# [phone] or nurse Mary Sella [phone]) Pulmonary Fibrosis (HX from daughter) Pulmonary HTN (Hx from daughter) Chronic Diastolic CHF  (Hx from daughter) Emphysema (Hx from old notes) Former Tobacco Smoker (2ppd  for 25 yrs) Advanced age Dementia --with occas agitation Enlarged Prostate Hypothyroidism Loss of appetite --associated with dementia Nausea/ vomiting/ diarrhea --thought to be viral Abdominal Pain  ---lasting for several weeks now (per family report) Peripheral arterial disease Metabolic Acidosis associated with renal failure  Liver Cysts and Elevated Liver Enzymes ---probably cysts but smaller lesions cannot be fully characterized as benign as they are too small to identify B12 Deficiency (on a very low dose of B12 orally) Splenomegaly (which may be associated with his pancytopenia) Rectal Thickening which may represent proctitis/ colitis  CAD  -----------------------------------------------------------------------   REVIEW OF SYSTEMS:  Patient is not able to provide ROS due to dementia  SPIRITUAL SUPPORT SYSTEM: Yes --daughter and granddaughter.  SOCIAL HISTORY:  reports that he quit smoking about 45 years ago. He does not have any smokeless tobacco history on file. He reports that he does not drink alcohol.  Came from ALF  --Has limited funds for long term care but makes too much Economist plus [facility] to get Medicaid).  Daughter involved in discussions with [facility] for more coverage.   LEGAL DOCUMENTS:  I have been given a copy of [place] which has a 'medical clause' contained in it.  There is NO [place], so the daughter named in this POA document is the Media planner.    CODE STATUS: DNR  PAST MEDICAL HISTORY: Past Medical History  Diagnosis Date  . Hypothyroidism   . Arthritis   . HTN (hypertension)   . Resting tremor     PAST SURGICAL HISTORY:  Past Surgical History  Procedure Laterality Date  . Appendectomy    . Cholecystectomy      ALLERGIES:  has No Known Allergies.  MEDICATIONS:  Current Facility-Administered Medications  Medication Dose Route Frequency Provider Last Rate Last Dose  . acetaminophen (TYLENOL) tablet  650 mg  650 mg Oral Q6H PRN [name], MD       Or  . acetaminophen (TYLENOL) suppository 650 mg  650 mg Rectal Q6H PRN [name], MD      . feeding supplement (ENSURE ENLIVE) (ENSURE ENLIVE) liquid 237 mL  237 mL Oral BID BM Nicholes Mango, MD   237 mL at 08/26/15 1409  . haloperidol lactate (HALDOL) injection 1 mg  1 mg Intravenous Once PRN Nicholes Mango, MD      . heparin injection 5,000 Units  5,000 Units Subcutaneous 3 times per day [name], MD   5,000 Units at 08/26/15 1409  . levothyroxine (SYNTHROID, LEVOTHROID) tablet 125 mcg  125 mcg Oral QAC breakfast [name], MD   125 mcg at 08/26/15 1001  . morphine 2 MG/ML injection 2 mg  2 mg Intravenous Q4H PRN [name], MD      . multivitamin with minerals tablet 1 tablet  1 tablet Oral Daily [name], MD   1 tablet at 08/26/15 1002  . ondansetron (ZOFRAN) tablet 4 mg  4 mg Oral Q6H PRN [name], MD   4 mg at 08/24/15 0540   Or  . ondansetron [facility]) injection 4 mg  4 mg Intravenous Q6H PRN [name], MD      . oxyCODONE (Oxy IR/ROXICODONE) immediate release tablet 5 mg  5 mg Oral Q4H PRN [name], MD      . vitamin B-12 (CYANOCOBALAMIN) tablet 100 mcg  100 mcg Oral Daily [name], MD   100 mcg at 08/26/15 1002    Vital Signs: BP 93/51 mmHg  Pulse 71  Temp(Src) 97.6 F (36.4 C) (Axillary)  Resp 16  Ht 6' (1.829 m)  Wt 60.691 kg (133 lb 12.8 oz)  BMI 18.14 kg/m2  SpO2 96% Filed Weights   08/23/15 2114 08/24/15 0135  Weight: 63.504 kg (140 lb) 60.691 kg (133 lb 12.8 oz)    Estimated body mass index is 18.14 kg/(m^2) as calculated from the following:   Height as of this encounter: 6' (1.829 m).   Weight as of this encounter: 60.691 kg (133 lb 12.8 oz).  PERFORMANCE STATUS (ECOG) : 4 - Bedbound  PHYSICAL EXAM: Emaciated in appearance NAD --lying on side in NAD Pale Eyes closed No JVD or TM Hrt rrr no m Lungs cta ant Abd soft and NT Ext no mottling or cyanosis   LABS: CBC:     Component Value Date/Time   WBC 3.1* 08/26/2015 0507   HGB 7.1* 08/26/2015 0507   HCT 20.5* 08/26/2015 0507   PLT 111* 08/26/2015 0507   MCV 92.8 08/26/2015 0507   NEUTROABS 8.1* 08/23/2015 2122   LYMPHSABS 3.3 08/23/2015 2122   MONOABS 1.1* 08/23/2015 2122   EOSABS 0.1 08/23/2015 2122   BASOSABS 0.1 08/23/2015 2122   Comprehensive Metabolic Panel:    Component Value Date/Time   NA 143 08/26/2015 0507   K 3.4* 08/26/2015 0507   CL 118* 08/26/2015 0507   CO2 19* 08/26/2015 0507   BUN 41* 08/26/2015 0507   CREATININE 1.92* 08/26/2015 0507   GLUCOSE 73 08/26/2015 0507   CALCIUM 7.9* 08/26/2015 0507   AST 74* 08/23/2015 2222   ALT 20 08/23/2015 2222   ALKPHOS 78 08/23/2015 2222   BILITOT 3.5* 08/23/2015 2222   PROT 7.4 08/23/2015 2222   ALBUMIN 3.9 08/23/2015 2222   TESTS:  CT Abd / Pelvis wo CM 08/26/15: 1. Massive enlargement of the prostate gland indenting the bladder base. On today's CT, I do not see a separate mass of the urinary bladder. On ultrasound, the polypoid lesion was right along the edge of the prostate, and my suspicion is that this probably represented lobulation of the prostate gland or conceivably some debris in the bladder rather than a urothelial malignancy given the appearance on today's multiplanar noncontrast CT. It is conceivable that a posterior inferior bladder lesion could be obscured along the prostate gland although in practical terms this rarely happens. Prostate volume 690 cc. 2. Third spacing of fluid with interstitial accentuation in the lung bases, and low-level diffuse mesenteric and subcutaneous edema. Small bilateral pleural effusions. 3. Low-density blood pool compatible with anemia. 4. Coronary atherosclerosis. 5. Scattered hypodense lesions in the liver; larger lesions are likely cysts, but some of the smaller lesions are technically too small to characterize. 6. Splenomegaly, splenic volume 690 cc. 7. Small left kidney lower  pole cyst. 8.  Mild generalized wall thickening in the rectum, significance uncertain, low grade inflammation not excluded. 9. Aortoiliac atherosclerotic vascular disease. 10. Small left inguinal hernia contains adipose tissue. 11. Mild impingement due to lumbar spondylosis and degenerative disc disease as discussed above.  US kidneys 08/24/15: 1. No hydronephrosis. 2. Mildly echogenic kidneys, a nonspecific finding indicating renal parenchymal disease of uncertain chronicity. 3. Bilateral benign-appearing renal cysts. 4. Markedly enlarged prostate. Diffusely mildly thickened and trabeculated bladder wall, suggesting chronic bladder outlet obstruction. 5. Nonspecific 1.8 x 1.5 x 2.2 cm polypoid structure in the posterior midline bladder wall, cannot exclude a primary bladder neoplasm. Urology consultation and correlation with urinalysis and cystoscopy is advised.    More than 50% of the visit was spent in counseling/coordination of care: Yes  Time Spent: 80 minutes

## 2015-08-26 NOTE — Evaluation (Signed)
Physical Therapy Evaluation Patient Details Name: Bernard [name] MRN: [id number] DOB: 05-15-1919 Today's Date: 08/26/2015   History of Present Illness  81 yo male with onset of acute kidney injury after vomiting and nausea, diarrhea at home.  Pt is resident of ALF.  PMHx:  Bladder polyp, Hypothyroidism, Peripheral arterial disease  Clinical Impression  Pt was seen for initiating gait and was limited by his cognition and general weakness and poor endurance.  He is appropriate for short stay to increase gait independence as he is not wanting to use AD, has previously refused to use in ALF.  However, will need to reintroduce given his cognition.    Follow Up Recommendations SNF    Equipment Recommendations  None recommended by PT (await SNF disposition)    Recommendations for Other Services       Precautions / Restrictions Precautions Precautions: Fall (telemetry) Restrictions Weight Bearing Restrictions: No      Mobility  Bed Mobility Overal bed mobility: Needs Assistance Bed Mobility: Supine to Sit;Sit to Supine     Supine to sit: Min assist Sit to supine: Min assist   General bed mobility comments: uses HOB elevated and bedrail to get OOB and back with cues and bed flat  Transfers Overall transfer level: Needs assistance Equipment used: 1 person hand held assist Transfers: Sit to/from [facility] Sit to Stand: Min assist Stand pivot transfers: Min assist       General transfer comment: Pt needs cues for hand placement 100% of the time  Ambulation/Gait Ambulation/Gait assistance: Min assist Ambulation Distance (Feet): 70 Feet Assistive device: 1 person hand held assist (Pt will not hang onto ) Gait Pattern/deviations: Step-through pattern;Decreased step length - right;Decreased step length - left;Wide base of support;Trunk flexed;Drifts right/left (reaching for hall rail, refused the IV pole as assist) Gait velocity: reduced Gait velocity  interpretation: Below normal speed for age/gender    Stairs            Wheelchair Mobility    Modified Rankin (Stroke Patients Only)       Balance Overall balance assessment: Needs assistance Sitting-balance support: Feet supported Sitting balance-Leahy Scale: Fair   Postural control: Posterior lean Standing balance support: Single extremity supported Standing balance-Leahy Scale: Poor                               Pertinent Vitals/Pain Pain Assessment: No/denies pain    Home Living Family/patient expects to be discharged to:: Assisted living               Home Equipment: Walker - 2 wheels;Shower seat - built in;Bedside commode;Grab bars - toilet Additional Comments: Staff will not likely be able to assist him with gait    Prior Function Level of Independence: Independent               Hand Dominance        Extremity/Trunk Assessment   Upper Extremity Assessment: Overall WFL for tasks assessed           Lower Extremity Assessment: Generalized weakness      Cervical / Trunk Assessment: Kyphotic  Communication   Communication: HOH  Cognition Arousal/Alertness: Awake/alert Behavior During Therapy: Flat affect Overall Cognitive Status: History of cognitive impairments - at baseline       Memory: Decreased short-term memory;Decreased recall of precautions              General Comments General comments (skin integrity,  edema, etc.): Pt is getting up to bedside with encouragement, famlly in and not involved with visit.      Exercises        Assessment/Plan    PT Assessment Patient needs continued PT services  PT Diagnosis Generalized weakness   PT Problem List Decreased strength;Decreased range of motion;Decreased activity tolerance;Decreased balance;Decreased mobility;Decreased coordination;Decreased cognition;Decreased knowledge of use of DME;Decreased safety awareness;Decreased knowledge of  precautions;Cardiopulmonary status limiting activity  PT Treatment Interventions DME instruction;Gait training;Functional mobility training;Therapeutic activities;Therapeutic exercise;Balance training;Neuromuscular re-education;Patient/family education   PT Goals (Current goals can be found in the Care Plan section) Acute Rehab PT Goals Patient Stated Goal: to walk  PT Goal Formulation: With patient/family Time For Goal Achievement: 09/09/15 Potential to Achieve Goals: Good    Frequency Min 2X/week   Barriers to discharge Decreased caregiver support;Other (comment) (ALF will not be able to help)      Co-evaluation               End of Session Equipment Utilized During Treatment: Gait belt Activity Tolerance: Patient tolerated treatment well;Patient limited by fatigue Patient left: in bed;with call bell/phone within reach;with bed alarm set;with family/visitor present (declines to sit up for lunch) Nurse Communication: Mobility status;Other (comment) (case manager about his functional level)         Time: 1135-1203 PT Time Calculation (min) (ACUTE ONLY): 28 min   Charges:   PT Evaluation $Initial PT Evaluation Tier I: 1 Procedure PT Treatments $Gait Training: 8-22 mins   PT G Codes:        Ramond Dial 11-Sep-2015, 1:09 PM   Mee Hives, PT MS Acute Rehab Dept. Number: [phone] [id number] and [place] [phone]

## 2015-08-26 NOTE — Progress Notes (Signed)
[facility] [place] NAME: Bernard [name]    MR#:  [id number]  DATE OF BIRTH:  11/12/1918  SUBJECTIVE:  CHIEF COMPLAINT:  Patient is demented at his baseline. Poor historian, daughter who is his healthcare power of attorney is at bedside providing history. Family is worried that patient's hemoglobin dropped from 11.8 at the time of admission to 7.1 today. Patient is resting comfortably  REVIEW OF SYSTEMS:  Review of systems unobtainable as patient is demented  DRUG ALLERGIES:  No Known Allergies  VITALS:  Blood pressure 93/51, pulse 71, temperature 97.6 F (36.4 C), temperature source Axillary, resp. rate 16, height 6' (1.829 m), weight 60.691 kg (133 lb 12.8 oz), SpO2 96 %.  PHYSICAL EXAMINATION:  GENERAL:  81 y.o.-year-old patient lying in the bed with no acute distress.  EYES: Pupils equal, round, reactive to light and accommodation. No scleral icterus.  HEENT: Head atraumatic, normocephalic. Oropharynx and nasopharynx clear. Dry mucous membranes NECK:  Supple, no jugular venous distention. No thyroid enlargement, no tenderness.  LUNGS: Normal breath sounds bilaterally, and the basal rales and rhonchi . No use of accessory muscles of respiration.  CARDIOVASCULAR: S1, S2 normal. No murmurs, rubs, or gallops.  ABDOMEN: Soft, nontender, nondistended. Bowel sounds present. No organomegaly or mass.  EXTREMITIES: No pedal edema, cyanosis, or clubbing.  NEUROLOGIC: Patient with chronic dementia and doesn't follow verbal commands, PSYCHIATRIC: The patient is with chronic baseline dementia  SKIN: No obvious rash, lesion, or ulcer.    LABORATORY PANEL:   CBC  Recent Labs Lab 08/26/15 0507  WBC 3.1*  HGB 7.1*  HCT 20.5*  PLT 111*   ------------------------------------------------------------------------------------------------------------------  Chemistries   Recent Labs Lab 08/23/15 2222  08/26/15 0507  NA 137  < > 143  K  4.7  < > 3.4*  CL 104  < > 118*  CO2 21*  < > 19*  GLUCOSE 153*  < > 73  BUN 59*  < > 41*  CREATININE 2.81*  < > 1.92*  CALCIUM 9.5  < > 7.9*  AST 74*  --   --   ALT 20  --   --   ALKPHOS 78  --   --   BILITOT 3.5*  --   --   < > = values in this interval not displayed. ------------------------------------------------------------------------------------------------------------------  Cardiac Enzymes No results for input(s): TROPONINI in the last 168 hours. ------------------------------------------------------------------------------------------------------------------  RADIOLOGY:  Ct Abdomen Pelvis Wo Contrast  08/26/2015  CLINICAL DATA:  Acute kidney injury, with some improvement in the creatinine. Polypoid bladder structure on ultrasound. EXAM: CT ABDOMEN AND PELVIS WITHOUT CONTRAST TECHNIQUE: Multidetector CT imaging of the abdomen and pelvis was performed following the standard protocol without IV contrast. COMPARISON:  08/24/2015 FINDINGS: Lower chest: Interstitial accentuation peripherally in the lung bases with associated airway thickening and somewhat dilated vessels in the periphery of the lower lobes. Small bilateral pleural effusions. Low-density blood pool compatible with anemia. Coronary atherosclerotic calcification. Calcification of the aortic valve leaflets. Hepatobiliary: Scattered hypodense lesions in the liver, with mostly fluid density although some are technically too small to characterize or have indistinct borders. Suspected mild periportal edema there is some faint calcifications in central portions of the right and left hepatic lobe which may be vascular. Pancreas: Unremarkable Spleen: Splenomegaly observed, splenic volume 690 cc. Adrenals/Urinary Tract: 2.2 by 1.8 cm left kidney lower pole hypodense lesion, internal density -5 Hounsfield units, favoring cyst. Appearance compatible with that noted on ultrasound. No  stones identified. With respect to the urinary bladder,  I see no urinary bladder mass separate from the massively enlarged prostate gland on today's noncontrast exam. The previously noted lesion characterized as a polyp was along the posterior margin of the bladder just above the prostate. On some images it appears to be along the margin of the prostate, for example image 68 from the prior ultrasound. Stomach/Bowel: Mild generalized wall thickening in the rectum, significance uncertain. Vascular/Lymphatic: Aortoiliac atherosclerotic vascular disease. No pathologic adenopathy identified. Reproductive: Prominent prostate enlargement, gland measuring 350 cc volume. Somewhat high density but symmetric seminal vesicles. Other: Diffuse mild subcutaneous and mesenteric edema. Musculoskeletal: Small left inguinal hernia contains adipose tissue. Lumbar spondylosis and degenerative disc disease with mild right foraminal impingement at L2-3 and mild left foraminal impingement at L4-5. IMPRESSION: 1. Massive enlargement of the prostate gland indenting the bladder base. On today's CT, I do not see a separate mass of the urinary bladder. On ultrasound, the polypoid lesion was right along the edge of the prostate, and my suspicion is that this probably represented lobulation of the prostate gland or conceivably some debris in the bladder rather than a urothelial malignancy given the appearance on today's multiplanar noncontrast CT. It is conceivable that a posterior inferior bladder lesion could be obscured along the prostate gland although in practical terms this rarely happens. Prostate volume 690 cc. 2. Third spacing of fluid with interstitial accentuation in the lung bases, and low-level diffuse mesenteric and subcutaneous edema. Small bilateral pleural effusions. 3. Low-density blood pool compatible with anemia. 4. Coronary atherosclerosis. 5. Scattered hypodense lesions in the liver; larger lesions are likely cysts, but some of the smaller lesions are technically too small to  characterize. 6. Splenomegaly, splenic volume 690 cc. 7. Small left kidney lower pole cyst. 8. Mild generalized wall thickening in the rectum, significance uncertain, low grade inflammation not excluded. 9.  Aortoiliac atherosclerotic vascular disease. 10. Small left inguinal hernia contains adipose tissue. 11. Mild impingement due to lumbar spondylosis and degenerative disc disease as discussed above. Electronically Signed   By: Van Clines M.D.   On: 08/26/2015 15:01    EKG:  No orders found for this or any previous visit.  ASSESSMENT AND PLAN:   81 year old Caucasian gentleman history of dementia as well as essential hypertension presenting after nausea vomiting abdominal pain 1. Acute kidney injury, likely prerenal from dehydration leading to acute tubular necrosis :  Improving with IV fluids but poor by mouth intake  creatinine is improving - 2.81-->2.66-->2.18-->1.92 renal ultrasound with no hydronephrosis but has revealed bladder polyp  avoid further nephrotoxic agents  family does not wish to have aggressive care  #Failure to thrive with Abnormal CAT scan of the abdomen with massively enlarged prostate with liver lesions and rectal wall thickening Patient is failure to thrive with poor by mouth intake Family is considering comfort care versus hospice care Would like to discuss with palliative care and not considering any aggressive measures at this point Consult is placed to Dr. Megan Salon, palliative care  #Symptomatic anemia Patient's hemoglobin has dropped from 11.8 at the time of admission to 7.1 Hemoconcentration to hemodilution would explain up to some extent, but would not explain 5 point drop , but family is not considering any aggressive measures including colonoscopy at this point and not considering GI consult at this time Will transfuse 1 unit of blood Check stool for Hemoccult  #Bladder polyp ? Concern for bladder neoplasm 1.8 x 1.5 x 2.2 cm polypoid structure in  the posterior midline bladder wall, cannot exclude a primary bladder Neoplasm Consult urology regarding recommendations.  Family is not considering aggressive measures though  #  intractable nausea, vomiting and diarrhea probably viral etiology  improving clinically Continue symptomatically treatment    #. Peripheral arterial disease: Unknown duration will consult vascular surgery for further recommendations maintain adequate blood pressure  # Essential hypertension: Hold diuretics  4. Hypothyroidism unspecified: Synthroid 5. Venous thromboembolism prophylactic: Heparin subcutaneous   Disposition-will follow up with palliative care. Patient's daughter(healthcare power of attorney) is considering comfort care versus hospice  All the records are reviewed and case discussed with Care Management/Social Workerr. Management plans discussed with the patient, family and they are in agreement.  CODE STATUS: DNR  TOTAL TIME TAKING CARE OF THIS PATIENT: 35  minutes.   POSSIBLE D/C IN 1-2  DAYS, DEPENDING ON CLINICAL CONDITION.   Nicholes Mango M.D on 08/26/2015 at 4:38 PM  Between 7am to 6pm - Pager - [phone] After 6pm go to [email or web address] - password [email or web address] [facility]  [facility] [facility]  Office  [phone]  CC: Primary care physician; No primary care provider on file.

## 2015-08-26 NOTE — Care Management Important Message (Signed)
Important Message  Patient Details  Name: Bernard [name] MRN: [id number] Date of Birth: Aug 24, 1919   Medicare Important Message Given:  Yes    Juliann Pulse A Alvena Kiernan 08/26/2015, 11:19 AM

## 2015-08-26 NOTE — Plan of Care (Signed)
RN confirmed with blood bank that pt type and screen revealed possible antibodies which will take longer to match and receive blood.  Blood bank anticipates that the blood should probably be available for transfusion around 1800 and hopefully wont need to be found externally. Dr. Also notified and ask to speak with family concerning CT of Abd and pelvis results. Family informed of updated POC.

## 2015-08-26 NOTE — Plan of Care (Addendum)
Dr. Kandice Robinsons to get clarification on current pt POC per family request.  Family wanting to feed pt but also told pt was going to have CT of abd? Dr returned call and stated pt will be getting 1U of RBC and a CT of abd.  She per discussions with family .  Orders would be placed as possible.  Family informed.

## 2015-08-26 NOTE — Plan of Care (Signed)
Blood bank called to inform blood now ready to infuse.  With new surgery pt to floor, RN unable to begin transfusion prior to end of shift.  Pt will receive transfusion by 3rd shift RN.

## 2015-08-26 NOTE — Plan of Care (Signed)
Dr. Hulen Skains and to inform of pt low hemoglobin of 7.1.

## 2015-08-27 LAB — CBC
HCT: 31.6 % — ABNORMAL LOW (ref 40.0–52.0)
HEMATOCRIT: 28.3 % — AB (ref 40.0–52.0)
Hemoglobin: 10.4 g/dL — ABNORMAL LOW (ref 13.0–18.0)
Hemoglobin: 9.5 g/dL — ABNORMAL LOW (ref 13.0–18.0)
MCH: 31.2 pg (ref 26.0–34.0)
MCH: 31.3 pg (ref 26.0–34.0)
MCHC: 32.9 g/dL (ref 32.0–36.0)
MCHC: 33.6 g/dL (ref 32.0–36.0)
MCV: 94.9 fL (ref 80.0–100.0)
MCV: 93.2 fL (ref 80.0–100.0)
PLATELETS: 145 10*3/uL — AB (ref 150–440)
Platelets: 128 10*3/uL — ABNORMAL LOW (ref 150–440)
RBC: 3.33 MIL/uL — AB (ref 4.40–5.90)
RBC: 3.04 MIL/uL — ABNORMAL LOW (ref 4.40–5.90)
RDW: 18.5 % — ABNORMAL HIGH (ref 11.5–14.5)
RDW: 18.1 % — AB (ref 11.5–14.5)
WBC: 5.2 10*3/uL (ref 3.8–10.6)
WBC: 4.5 10*3/uL (ref 3.8–10.6)

## 2015-08-27 LAB — BASIC METABOLIC PANEL
ANION GAP: 7 (ref 5–15)
Anion gap: 7 (ref 5–15)
BUN: 32 mg/dL — AB (ref 6–20)
BUN: 30 mg/dL — AB (ref 6–20)
CALCIUM: 8.4 mg/dL — AB (ref 8.9–10.3)
CALCIUM: 8.4 mg/dL — AB (ref 8.9–10.3)
CHLORIDE: 119 mmol/L — AB (ref 101–111)
CO2: 20 mmol/L — AB (ref 22–32)
CO2: 20 mmol/L — AB (ref 22–32)
CREATININE: 1.64 mg/dL — AB (ref 0.61–1.24)
CREATININE: 1.57 mg/dL — AB (ref 0.61–1.24)
Chloride: 117 mmol/L — ABNORMAL HIGH (ref 101–111)
GFR calc Af Amer: 40 mL/min — ABNORMAL LOW (ref >60)
GFR calc non Af Amer: 34 mL/min — ABNORMAL LOW (ref >60)
GFR calc non Af Amer: 36 mL/min — ABNORMAL LOW (ref >60)
GFR, EST AFRICAN AMERICAN: 42 mL/min — AB (ref >60)
GLUCOSE: 96 mg/dL (ref 65–99)
Glucose, Bld: 74 mg/dL (ref 65–99)
POTASSIUM: 5.2 mmol/L — AB (ref 3.5–5.1)
Potassium: 3.7 mmol/L (ref 3.5–5.1)
SODIUM: 144 mmol/L (ref 135–145)
Sodium: 146 mmol/L — ABNORMAL HIGH (ref 135–145)

## 2015-08-27 LAB — OCCULT BLOOD X 1 CARD TO LAB, STOOL: FECAL OCCULT BLD: NEGATIVE

## 2015-08-27 LAB — PSA: PSA: 13.55 ng/mL — ABNORMAL HIGH (ref 0.00–4.00)

## 2015-08-27 MED ORDER — ACETAMINOPHEN 325 MG PO TABS: 650.0000 mg | ORAL_TABLET | Freq: Four times a day (QID) | ORAL | Status: AC | PRN

## 2015-08-27 MED ORDER — PANTOPRAZOLE SODIUM 40 MG PO TBEC: 40.0000 mg | DELAYED_RELEASE_TABLET | Freq: Every day | ORAL | Status: AC

## 2015-08-27 MED ORDER — OXYCODONE HCL 5 MG PO TABS: 5.0000 mg | ORAL_TABLET | ORAL | Status: AC | PRN

## 2015-08-27 MED ORDER — FUROSEMIDE 40 MG PO TABS: 20.0000 mg | ORAL_TABLET | Freq: Every day | ORAL | Status: AC

## 2015-08-27 MED ORDER — ENSURE ENLIVE PO LIQD: 237.0000 mL | Freq: Two times a day (BID) | ORAL | Status: AC

## 2015-08-27 MED ORDER — SODIUM POLYSTYRENE SULFONATE 15 GM/60ML PO SUSP: 30.0000 g | Freq: Once | ORAL | Status: DC

## 2015-08-27 MED ORDER — ONDANSETRON HCL 4 MG PO TABS: 4.0000 mg | ORAL_TABLET | Freq: Four times a day (QID) | ORAL | Status: AC | PRN

## 2015-08-27 NOTE — Clinical Social Work Note (Signed)
MD prepared patient for discharge later today after a lab result returns. Patient is to go to [facility] this afternoon and Doug at [facility] has the discharge information. Nurse to call report. Patient to transport via EMS. Patient's Palliative Care consult request sent with patient's information. Patient's daughter is aware of discharge and is currently in agreement. Shela Leff MSW,LCSW [phone]

## 2015-08-27 NOTE — Progress Notes (Signed)
1 unit of blood has finished. Pt tolerated it well. VSS. Will continue to monitor pt.   Bernard [name]

## 2015-08-27 NOTE — Progress Notes (Signed)
1 unit of blood started. VSS and consent has been signed. Will continue to assess.  Angus Seller

## 2015-08-27 NOTE — Progress Notes (Signed)
Palliative Medicine Inpatient Consult Follow Up Note   Name: Bernard [name] Date: 08/27/2015 MRN: [id number]  DOB: 1919/09/15  Referring Physician: Nicholes Mango, MD  Palliative Care consult requested for this 81 y.o. male for goals of medical therapy in patient with Severe Anemia, Abdominal Pain, and very, very poor oral intake.  TODAY'S DISCUSSIONS AND DECISIONS: 1.  Pt remains DNR and portable DNR form is in paper chart.  2.  I have learned from Dr Margaretmary Eddy earlier today that pt is to go to SNF for short term rehab.  He is to have a [place] while getting short term rehab.  3.  The patient will likely be appropriate for transition over to being under [facility] in a facility setting and that is why he should have palliative care consult while getting rehab.    4.  I have called Dr Lennette Bihari Little's office and they are to fax over pts 2d echo / last progress note about aortic stenosis --so we can possibly have a 'hospice diagnosis' for pt (heart condition =aortic stenosis will qualify him for Hospice IF it is severe --we will have to see).  I can follow up on this even if pt is discharged to [facility] today.  Will pass it on to [facility] for Palliative Consult's benefit.    5.  Most of the anxiety that pt's daughter has is centered around money issues for long term care.  Pt makes a bit too much in monthly benefits to qualify for Medicaid. But --that could change with this recent hospital stay or with his need for a higher level of care, etc.  They need to go back to DSS. Also, they are going through a senator's office to try to get more from the [place], but they will have to change the requests with him going to a higher level of care.  And, bottom line, I cannot fix the situation about how to pay for his long term care (after his money runs out in a few months).  I can only advise about where to go for help working this out and, of course, advise about pt needing a Palliative Care  consult while he is getting rehab --so that he can transition to [place] when appropriate.  I did talk with social worker about the family's need to seek more info from DSS etc.     REVIEW OF SYSTEMS:  Patient is not able to provide ROS due to dementia  CODE STATUS: DNR   PAST MEDICAL HISTORY: Past Medical History  Diagnosis Date  . Hypothyroidism   . Arthritis   . HTN (hypertension)   . Resting tremor     PAST SURGICAL HISTORY:  Past Surgical History  Procedure Laterality Date  . Appendectomy    . Cholecystectomy      Vital Signs: BP 105/59 mmHg  Pulse 75  Temp(Src) 98 F (36.7 C) (Oral)  Resp 16  Ht 6' (1.829 m)  Wt 60.691 kg (133 lb 12.8 oz)  BMI 18.14 kg/m2  SpO2 100% Filed Weights   08/23/15 2114 08/24/15 0135  Weight: 63.504 kg (140 lb) 60.691 kg (133 lb 12.8 oz)    Estimated body mass index is 18.14 kg/(m^2) as calculated from the following:   Height as of this encounter: 6' (1.829 m).   Weight as of this encounter: 60.691 kg (133 lb 12.8 oz).  PHYSICAL EXAM: Up in bed --medical bed Alert and verbal but confused and confabulates when spoken  to EOMI OP clear Neck w/o JVD or Tm Hrt rrr no m Lungs cta ant Abd soft and NT Ext no mottling or cyanosis  LABS: CBC:    Component Value Date/Time   WBC 4.5 08/27/2015 1000   HGB 9.5* 08/27/2015 1000   HCT 28.3* 08/27/2015 1000   PLT 128* 08/27/2015 1000   MCV 93.2 08/27/2015 1000   NEUTROABS 8.1* 08/23/2015 2122   LYMPHSABS 3.3 08/23/2015 2122   MONOABS 1.1* 08/23/2015 2122   EOSABS 0.1 08/23/2015 2122   BASOSABS 0.1 08/23/2015 2122   Comprehensive Metabolic Panel:    Component Value Date/Time   NA 146* 08/27/2015 1000   K 3.7 08/27/2015 1000   CL 119* 08/27/2015 1000   CO2 20* 08/27/2015 1000   BUN 30* 08/27/2015 1000   CREATININE 1.57* 08/27/2015 1000   GLUCOSE 96 08/27/2015 1000   CALCIUM 8.4* 08/27/2015 1000   AST 74* 08/23/2015 2222   ALT 20 08/23/2015 2222   ALKPHOS 78 08/23/2015  2222   BILITOT 3.5* 08/23/2015 2222   PROT 7.4 08/23/2015 2222   ALBUMIN 3.9 08/23/2015 2222    More than 50% of the visit was spent in counseling/coordination of care: YES  Time Spent: 45 min

## 2015-08-27 NOTE — Progress Notes (Signed)
Pt VSS, No complaints of pain, Up to restroom voiding; Eating small amount of each meal after encouragement. Pt received discharge orders. Report was called to [facility] and given to [employer]. IV removed with dressing dry and intact. EMS called for non emergent transport.

## 2015-08-27 NOTE — Clinical Social Work Note (Signed)
Clinical Social Work Assessment  Patient Details  Name: Bernard [name] MRN: [id number] Date of Birth: 02-Feb-1919  Date of referral:  08/27/15               Reason for consult:  Facility Placement                Permission sought to share information with:  Family Supports, Chartered certified accountant granted to share information::  Yes, Verbal Permission Granted  Name::        Agency::     Relationship::     Contact Information:     Housing/Transportation Living arrangements for the past 2 months:  [place], [name] of Information:  Adult Children Patient Interpreter Needed:  None Criminal Activity/Legal Involvement Pertinent to Current Situation/Hospitalization:  No - Comment as needed Significant Relationships:  Adult Children Lives with:  Facility Resident Do you feel safe going back to the place where you live?  Yes Need for family participation in patient care:  Yes (Comment)  Care giving concerns:  Patient resides at [facility] and has only been there for approximately 5 weeks.   Social Worker assessment / plan:  CSW received PT recommendation for patient for STR. Palliative Care is following patient as well. CSW met with patient and his daughter: [name]: [phone] in patient's room. Patient not very aware of his surroundings but very pleasant and smiling and holding a visitor's hand. Patient's daughter confirmed patient has been a resident at [facility] but they have not been pleased with his care. Patient's daughter states she is in agreement with PT recommendations for STR. CSW to have STR benefits checked. Bedsearch to be initiated.  Employment status:  Retired Nurse, adult PT Recommendations:  [place] / Referral to community resources:  [place]  Patient/Family's Response to care:  Patient's daughter willing to have patient go to  [facility].  Patient/Family's Understanding of and Emotional Response to Diagnosis, Current Treatment, and Prognosis:  Patient's daughter expressed concern that they were going to be meeting with [facility] regarding concerns that they were having regarding patient's care.  Emotional Assessment Appearance:  Appears stated age Attitude/Demeanor/Rapport:   (pleasant and smiling) Affect (typically observed):  Calm Orientation:  Oriented to Self Alcohol / Substance use:  Not Applicable Psych involvement (Current and /or in the community):  No (Comment)  Discharge Needs  Concerns to be addressed:  Care Coordination Readmission within the last 30 days:  No Current discharge risk:  None Barriers to Discharge:  No Barriers Identified   Shela Leff, LCSW 08/27/2015, 10:19 AM

## 2015-08-27 NOTE — Discharge Summary (Signed)
[facility] NAME: Bernard [name]    MR#:  [id number]  DATE OF BIRTH:  October 02, 1918  DATE OF ADMISSION:  08/23/2015 ADMITTING PHYSICIAN: [name], MD  DATE OF DISCHARGE:08/27/2015 PRIMARY CARE PHYSICIAN: No primary care provider on file.    ADMISSION DIAGNOSIS:  Dehydration [E86.0] Acute renal injury ([place]) [N17.9]  DISCHARGE DIAGNOSIS:  Principal Problem:   Acute kidney injury ([place]) Active Problems:   Protein-calorie malnutrition, severe Massively enlarged Prostate FTT SECONDARY DIAGNOSIS:   Past Medical History  Diagnosis Date  . Hypothyroidism   . Arthritis   . HTN (hypertension)   . Resting tremor     HOSPITAL COURSE:    81 year old Caucasian gentleman history of dementia as well as essential hypertension presenting after nausea vomiting abdominal pain 1. Acute kidney injury, likely prerenal from dehydration leading to acute tubular necrosis :  Improved with IV fluids but poor by mouth intake creatinine is improving - 2.81-->2.66-->2.18-->1.92-->1.64 renal ultrasound with no hydronephrosis but has revealed bladder polyp avoid further nephrotoxic agents family does not wish to have aggressive care  #Failure to thrive with Abnormal CAT scan of the abdomen with massively enlarged prostate with liver lesions and rectal wall thickening Patient is failure to thrive with poor by mouth intake- provide dietary supplements as tolerates Family is considering comfort care versus hospice care, but doesn't meet hospice home criteria per palliative care Appreciate Dr. Megan Salon, palliative care recommendations- SNF with palliative care f/u   #Symptomatic anemia Patient's hemoglobin has dropped from 11.8 at the time of admission to 7.1, s/p PRBC -today's hb is at 9.5 Hemoconcentration to hemodilution would explain up to some extent, but would not explain 5 point drop , but family is not considering any aggressive  measures including colonoscopy at this point and not considering GI consult at this time Neg stool for Hemoccult  #Bladder polyp ? Concern for bladder neoplasm 1.8 x 1.5 x 2.2 cm polypoid structure in the posterior midline bladder wall, cannot exclude a primary bladder Neoplasm  urology consulted curbside regarding recommendations.- recommends op f/u if family wants to pursue, but not recommending any at this time  Family is not considering aggressive measures though  # intractable nausea, vomiting and diarrhea probably viral etiology  improved clinically Continue symptomatically treatment   #. Peripheral arterial disease: Unknown duration will consult vascular surgery for further recommendations maintain adequate blood pressure  # Essential hypertension: resume  Diuretics at small dose and monitor  #. Hypothyroidism unspecified: Synthroid  #. Venous thromboembolism prophylactic: Heparin subcutaneous during hospital course   Disposition- SNF with palliative care f/u   DISCHARGE CONDITIONS:   fair  CONSULTS OBTAINED:  Treatment Team:  [name], MD Nicholes Mango, MD   PROCEDURES none  DRUG ALLERGIES:  No Known Allergies  DISCHARGE MEDICATIONS:   Current Discharge Medication List    START taking these medications   Details  acetaminophen (TYLENOL) 325 MG tablet Take 2 tablets (650 mg total) by mouth every 6 (six) hours as needed for mild pain (or Fever >/= 101).    feeding supplement, ENSURE ENLIVE, (ENSURE ENLIVE) LIQD Take 237 mLs by mouth 2 (two) times daily between meals. Qty: 237 mL, Refills: 12    ondansetron (ZOFRAN) 4 MG tablet Take 1 tablet (4 mg total) by mouth every 6 (six) hours as needed for nausea. Qty: 20 tablet, Refills: 0    oxyCODONE (OXY IR/ROXICODONE) 5 MG immediate release tablet Take 1 tablet (5 mg  total) by mouth every 4 (four) hours as needed for moderate pain. Qty: 30 tablet, Refills: 0    pantoprazole (PROTONIX) 40 MG tablet Take 1  tablet (40 mg total) by mouth daily before breakfast. Qty: 30 tablet, Refills: 0      CONTINUE these medications which have CHANGED   Details  furosemide (LASIX) 40 MG tablet Take 0.5 tablets (20 mg total) by mouth daily. Qty: 30 tablet, Refills: 0      CONTINUE these medications which have NOT CHANGED   Details  levothyroxine (SYNTHROID, LEVOTHROID) 125 MCG tablet Take 125 mcg by mouth daily before breakfast.    Multiple Vitamin (MULTIVITAMIN) tablet Take 1 tablet by mouth daily.    vitamin B-12 (CYANOCOBALAMIN) 100 MCG tablet Take 100 mcg by mouth daily.      STOP taking these medications     ibuprofen (ADVIL,MOTRIN) 200 MG tablet      Multiple Vitamins-Minerals (PRESERVISION AREDS PO)      potassium chloride (KLOR-CON) 8 MEQ tablet          DISCHARGE INSTRUCTIONS:   Activity as tolerated  Diet- reg as tolerated  F/u pcp in 3-5 days  F/u with palliative care in 1 week   DIET:   Diet- reg as tolerated  DISCHARGE CONDITION:  Fair  ACTIVITY:  Activity as tolerated  OXYGEN:  Home Oxygen: No.   Oxygen Delivery: room air  DISCHARGE LOCATION:  nursing home   If you experience worsening of your admission symptoms, develop shortness of breath, life threatening emergency, suicidal or homicidal thoughts you must seek medical attention immediately by calling 911 or calling your MD immediately  if symptoms less severe.  You Must read complete instructions/literature along with all the possible adverse reactions/side effects for all the Medicines you take and that have been prescribed to you. Take any new Medicines after you have completely understood and accpet all the possible adverse reactions/side effects.   Please note  You were cared for by a hospitalist during your hospital stay. If you have any questions about your discharge medications or the care you received while you were in the hospital after you are discharged, you can call the unit and asked to speak  with the hospitalist on call if the hospitalist that took care of you is not available. Once you are discharged, your primary care physician will handle any further medical issues. Please note that NO REFILLS for any discharge medications will be authorized once you are discharged, as it is imperative that you return to your primary care physician (or establish a relationship with a primary care physician if you do not have one) for your aftercare needs so that they can reassess your need for medications and monitor your lab values.     Today  Chief Complaint  Patient presents with  . Diarrhea    Pt. arrived via EMS from "[facility] [place]" for Nausea, vomitting and diarrhea.     ROS: Pt is a poor historian with dementia  VITAL SIGNS:  Blood pressure 105/59, pulse 75, temperature 98 F (36.7 C), temperature source Oral, resp. rate 16, height 6' (1.829 m), weight 60.691 kg (133 lb 12.8 oz), SpO2 100 %.  I/O:    Intake/Output Summary (Last 24 hours) at 08/27/15 1416 Last data filed at 08/27/15 0900  Gross per 24 hour  Intake    350 ml  Output    600 ml  Net   -250 ml    PHYSICAL EXAMINATION:  GENERAL:  81  y.o.-year-old patient lying in the bed with no acute distress.  EYES: Pupils equal, round, reactive to light and accommodation. No scleral icterus.  HEENT: Head atraumatic, normocephalic. Oropharynx and nasopharynx clear.  NECK:  Supple, no jugular venous distention. No thyroid enlargement, no tenderness.  LUNGS: Normal breath sounds bilaterally, no wheezing, rales,rhonchi or crepitation. No use of accessory muscles of respiration.  CARDIOVASCULAR: S1, S2 normal. No murmurs, rubs, or gallops.  ABDOMEN: Soft, non-tender, non-distended. Bowel sounds present. No organomegaly or mass.  EXTREMITIES: No pedal edema, cyanosis, or clubbing.  NEUROLOGIC: Cranial nerves grossly intact. Muscle strength at his baseline.  Sensation intact. Gait not checked.  PSYCHIATRIC: The patient is alert and  oriented x 3.  SKIN: No obvious rash, lesion, or ulcer.   DATA REVIEW:   CBC  Recent Labs Lab 08/27/15 1000  WBC 4.5  HGB 9.5*  HCT 28.3*  PLT 128*    Chemistries   Recent Labs Lab 08/23/15 2222  08/27/15 1000  NA 137  < > 146*  K 4.7  < > 3.7  CL 104  < > 119*  CO2 21*  < > 20*  GLUCOSE 153*  < > 96  BUN 59*  < > 30*  CREATININE 2.81*  < > 1.57*  CALCIUM 9.5  < > 8.4*  AST 74*  --   --   ALT 20  --   --   ALKPHOS 78  --   --   BILITOT 3.5*  --   --   < > = values in this interval not displayed.  Cardiac Enzymes No results for input(s): TROPONINI in the last 168 hours.  Microbiology Results  Results for orders placed or performed during the hospital encounter of 08/23/15  MRSA PCR Screening     Status: None   Collection Time: 08/24/15 11:49 PM  Result Value Ref Range Status   MRSA by PCR NEGATIVE NEGATIVE Final    Comment:        The GeneXpert MRSA Assay (FDA approved for NASAL specimens only), is one component of a comprehensive MRSA colonization surveillance program. It is not intended to diagnose MRSA infection nor to guide or monitor treatment for MRSA infections.     RADIOLOGY:  Ct Abdomen Pelvis Wo Contrast  08/26/2015  CLINICAL DATA:  Acute kidney injury, with some improvement in the creatinine. Polypoid bladder structure on ultrasound. EXAM: CT ABDOMEN AND PELVIS WITHOUT CONTRAST TECHNIQUE: Multidetector CT imaging of the abdomen and pelvis was performed following the standard protocol without IV contrast. COMPARISON:  08/24/2015 FINDINGS: Lower chest: Interstitial accentuation peripherally in the lung bases with associated airway thickening and somewhat dilated vessels in the periphery of the lower lobes. Small bilateral pleural effusions. Low-density blood pool compatible with anemia. Coronary atherosclerotic calcification. Calcification of the aortic valve leaflets. Hepatobiliary: Scattered hypodense lesions in the liver, with mostly fluid  density although some are technically too small to characterize or have indistinct borders. Suspected mild periportal edema there is some faint calcifications in central portions of the right and left hepatic lobe which may be vascular. Pancreas: Unremarkable Spleen: Splenomegaly observed, splenic volume 690 cc. Adrenals/Urinary Tract: 2.2 by 1.8 cm left kidney lower pole hypodense lesion, internal density -5 Hounsfield units, favoring cyst. Appearance compatible with that noted on ultrasound. No stones identified. With respect to the urinary bladder, I see no urinary bladder mass separate from the massively enlarged prostate gland on today's noncontrast exam. The previously noted lesion characterized as a polyp was along the  posterior margin of the bladder just above the prostate. On some images it appears to be along the margin of the prostate, for example image 68 from the prior ultrasound. Stomach/Bowel: Mild generalized wall thickening in the rectum, significance uncertain. Vascular/Lymphatic: Aortoiliac atherosclerotic vascular disease. No pathologic adenopathy identified. Reproductive: Prominent prostate enlargement, gland measuring 350 cc volume. Somewhat high density but symmetric seminal vesicles. Other: Diffuse mild subcutaneous and mesenteric edema. Musculoskeletal: Small left inguinal hernia contains adipose tissue. Lumbar spondylosis and degenerative disc disease with mild right foraminal impingement at L2-3 and mild left foraminal impingement at L4-5. IMPRESSION: 1. Massive enlargement of the prostate gland indenting the bladder base. On today's CT, I do not see a separate mass of the urinary bladder. On ultrasound, the polypoid lesion was right along the edge of the prostate, and my suspicion is that this probably represented lobulation of the prostate gland or conceivably some debris in the bladder rather than a urothelial malignancy given the appearance on today's multiplanar noncontrast CT. It is  conceivable that a posterior inferior bladder lesion could be obscured along the prostate gland although in practical terms this rarely happens. Prostate volume 690 cc. 2. Third spacing of fluid with interstitial accentuation in the lung bases, and low-level diffuse mesenteric and subcutaneous edema. Small bilateral pleural effusions. 3. Low-density blood pool compatible with anemia. 4. Coronary atherosclerosis. 5. Scattered hypodense lesions in the liver; larger lesions are likely cysts, but some of the smaller lesions are technically too small to characterize. 6. Splenomegaly, splenic volume 690 cc. 7. Small left kidney lower pole cyst. 8. Mild generalized wall thickening in the rectum, significance uncertain, low grade inflammation not excluded. 9.  Aortoiliac atherosclerotic vascular disease. 10. Small left inguinal hernia contains adipose tissue. 11. Mild impingement due to lumbar spondylosis and degenerative disc disease as discussed above. Electronically Signed   By: Van Clines M.D.   On: 08/26/2015 15:01   US Renal  08/24/2015  CLINICAL DATA:  Acute kidney injury. EXAM: RENAL / URINARY TRACT ULTRASOUND COMPLETE COMPARISON:  None. FINDINGS: Right Kidney: Length: 9.1 cm. Mildly echogenic right kidney. No right hydronephrosis. Limited visualization of a 0.7 cm simple appearing cyst in the upper right kidney. Left Kidney: Length: 11.7 cm. Mildly echogenic left kidney. No left hydronephrosis. Simple 2.4 x 2.2 x 1.7 cm renal cyst in the interpolar left kidney. Bladder: The bladder wall appears diffusely mildly thickened and trabeculated. There is a nonspecific 1.8 x 1.5 x 2.2 cm polypoid lesion in the posterior midline bladder wall. The prostate is markedly enlarged, with approximate prostate measurements of 9.1 x 6.9 x 7.8 cm, for an approximate prostate volume of 258 cc. IMPRESSION: 1. No hydronephrosis. 2. Mildly echogenic kidneys, a nonspecific finding indicating renal parenchymal disease of  uncertain chronicity. 3. Bilateral benign-appearing renal cysts. 4. Markedly enlarged prostate. Diffusely mildly thickened and trabeculated bladder wall, suggesting chronic bladder outlet obstruction. 5. Nonspecific 1.8 x 1.5 x 2.2 cm polypoid structure in the posterior midline bladder wall, cannot exclude a primary bladder neoplasm. Urology consultation and correlation with urinalysis and cystoscopy is advised. Electronically Signed   By: Ilona Sorrel M.D.   On: 08/24/2015 13:25    EKG:  No orders found for this or any previous visit.    Management plans discussed with the patient's daughter - medical POA ,  family and they are in agreement.  CODE STATUS:     Code Status Orders        Start     Ordered  08/24/15 0032  Do not attempt resuscitation (DNR)   Continuous    Question Answer Comment  In the event of cardiac or respiratory ARREST Do not call a "code blue"   In the event of cardiac or respiratory ARREST Do not perform Intubation, CPR, defibrillation or ACLS   In the event of cardiac or respiratory ARREST Use medication by any route, position, wound care, and other measures to relive pain and suffering. May use oxygen, suction and manual treatment of airway obstruction as needed for comfort.      08/24/15 0031    Advance Directive Documentation        Most Recent Value   Type of Advance Directive  Healthcare Power of Attorney   Pre-existing out of facility DNR order (yellow form or pink MOST form)     "MOST" Form in Place?        TOTAL TIME TAKING CARE OF THIS PATIENT:45 minutes.    @[facility] @  on 08/27/2015 at 2:16 PM  Between 7am to 6pm - Pager - [phone]  After 6pm go to [email or web address] - password [email or web address] [facility]  [facility]  Office  [phone]  CC: Primary care physician; No primary care provider on file.

## 2015-08-27 NOTE — Progress Notes (Signed)
Pt potassium was 5.2 @ 0720. Lab was redrawn and potassium was 3.7; Dr. Margaretmary Eddy notified and orders received to discontinue Kaxalate.

## 2015-08-27 NOTE — Progress Notes (Signed)
SNF and Non-Emergent EMS Transport Benefits:  Number called: [phone] Rep: Sam Reference Number: [id number]  [place] Complete HMO Plan One active as of 08/21/15 with no deductible.  Out of pocket max is $4900, of which $131.76 met so far.  Current coverage ends 09/20/15, though patient does have plan for 2017 per rep.  In-network SNF: $0 copay for days 1-20, a $160 daily copay for days 21-51, and a $0 copay for days 52-100.  Once out of pocket is reached, patient covered at 100% for remainder of 100 day benefit period.  $0 copay for professional fees and 3 day hospital stay is not required.  Josem Kaufmann is required: [phone].    Non-emergent EMS transport: $250 copay for each one way medically necessary, Medicare covered trip.  Josem Kaufmann is required: [phone].

## 2015-08-27 NOTE — Discharge Instructions (Signed)
Activity as tolerated  Diet- reg as tolerated F/u pcp in 3-5 days F/u with palliative care in 1 week

## 2015-08-27 NOTE — NC FL2 (Signed)
[place] LEVEL OF CARE SCREENING TOOL     IDENTIFICATION  Patient Name: Bernard [name] Birthdate: 24-Apr-1919 Sex: male Admission Date (Current Location): 08/23/2015  [facility] and [place] Number: Engineering geologist and Address:  [facility], [address], [place], [place] [address]      Provider Number: [id number]  Attending Physician Name and Address:  Nicholes Mango, MD  Relative Name and Phone Number:       Current Level of Care: Hospital Recommended Level of Care: [place] Prior Approval Number:    Date Approved/Denied:   PASRR Number:    Discharge Plan: SNF    Current Diagnoses: Patient Active Problem List   Diagnosis Date Noted  . Protein-calorie malnutrition, severe 08/25/2015  . Acute kidney injury ([place]) 08/24/2015  . PULMONARY FIBROSIS 08/23/2008    Orientation ACTIVITIES/SOCIAL BLADDER RESPIRATION    Self  Active Incontinent Normal  BEHAVIORAL SYMPTOMS/MOOD NEUROLOGICAL BOWEL NUTRITION STATUS   (none)  (none) Continent Diet  PHYSICIAN VISITS COMMUNICATION OF NEEDS Height & Weight Skin  30 days Verbally   133 lbs. Normal          AMBULATORY STATUS RESPIRATION    Supervision limited Normal      Personal Care Assistance Level of Assistance  Bathing, Feeding, Dressing Bathing Assistance: Limited assistance Feeding assistance: Limited assistance Dressing Assistance: Limited assistance      Functional Limitations Info  Sight, Hearing Sight Info: Impaired Hearing Info: Impaired         SPECIAL CARE FACTORS FREQUENCY  PT (By licensed PT)                   Additional Factors Info  Code Status, Allergies Code Status Info: DNR Allergies Info: NKA           Current Medications (08/27/2015):  This is the current hospital active medication list Current Facility-Administered Medications  Medication Dose Route Frequency Provider Last Rate Last Dose  . acetaminophen (TYLENOL) tablet  650 mg  650 mg Oral Q6H PRN [name], MD       Or  . acetaminophen (TYLENOL) suppository 650 mg  650 mg Rectal Q6H PRN [name], MD      . cyanocobalamin tablet 500 mcg  500 mcg Oral Daily [name], MD   500 mcg at 08/27/15 0900  . feeding supplement (ENSURE ENLIVE) (ENSURE ENLIVE) liquid 237 mL  237 mL Oral BID BM [name], MD   237 mL at 08/27/15 0900  . haloperidol lactate (HALDOL) injection 1 mg  1 mg Intravenous Once PRN Nicholes Mango, MD      . heparin injection 5,000 Units  5,000 Units Subcutaneous 3 times per day [name], MD   5,000 Units at 08/27/15 0525  . levothyroxine (SYNTHROID, LEVOTHROID) tablet 125 mcg  125 mcg Oral QAC breakfast [name], MD   125 mcg at 08/27/15 [phone]  . morphine 2 MG/ML injection 2 mg  2 mg Intravenous Q4H PRN [name], MD      . ondansetron [facility]) tablet 4 mg  4 mg Oral Q6H PRN [name], MD   4 mg at 08/24/15 0540   Or  . ondansetron [facility]) injection 4 mg  4 mg Intravenous Q6H PRN [name], MD      . oxyCODONE (Oxy IR/ROXICODONE) immediate release tablet 5 mg  5 mg Oral Q4H PRN [name], MD      . pantoprazole (  PROTONIX) EC tablet 40 mg  40 mg Oral QAC breakfast [name], MD   40 mg at 08/27/15 [id number]     Discharge Medications: Please see discharge summary for a list of discharge medications.  Relevant Imaging Results:  Relevant Lab Results:  Recent Labs    Additional Information SS: [id number]  Shela Leff, LCSW

## 2015-08-27 NOTE — Clinical Social Work Placement (Signed)
   CLINICAL SOCIAL WORK PLACEMENT  NOTE  Date:  08/27/2015  Patient Details  Name: Bernard [name] MRN: [id number] Date of Birth: 1919-01-21  Clinical Social Work is seeking post-discharge placement for this patient at the [place] level of care (*CSW will initial, date and re-position this form in  chart as items are completed):  Yes   Patient/family provided with [facility] Work Department's list of facilities offering this level of care within the geographic area requested by the patient (or if unable, by the patient's family).  Yes   Patient/family informed of their freedom to choose among providers that offer the needed level of care, that participate in Medicare, Medicaid or managed care program needed by the patient, have an available bed and are willing to accept the patient.  Yes   Patient/family informed of [facility]'s ownership interest in [facility] and [facility], as well as of the fact that they are under no obligation to receive care at these facilities.  PASRR submitted to EDS on 08/27/15     PASRR number received on 08/27/15     Existing PASRR number confirmed on       FL2 transmitted to all facilities in geographic area requested by pt/family on 08/27/15     FL2 transmitted to all facilities within larger geographic area on       Patient informed that his/her managed care company has contracts with or will negotiate with certain facilities, including the following:        Yes   Patient/family informed of bed offers received.  Patient chooses bed at  [facility])     Physician recommends and patient chooses bed at  [facility])    Patient to be transferred to  [facility]) on 08/27/15.  Patient to be transferred to facility by  (EMS)     Patient family notified on 08/27/15 of transfer.  Name of family member notified:  daughter     PHYSICIAN       Additional Comment:     _______________________________________________ Shela Leff, LCSW 08/27/2015, 3:35 PM

## 2015-08-30 LAB — TYPE AND SCREEN
ABO/RH(D): A NEG
Antibody Screen: POSITIVE
DONOR AG TYPE: NEGATIVE
UNIT DIVISION: 0
UNIT DIVISION: 0

## 2015-09-14 ENCOUNTER — Inpatient Hospital Stay
Admission: EM | Admit: 2015-09-14 | Discharge: 2015-09-19 | DRG: 682 | Disposition: A | Discharge status: Hospice | Payer: Medicare Other | Attending: Internal Medicine | Admitting: Internal Medicine

## 2015-09-14 ENCOUNTER — Encounter: Payer: Self-pay | Admitting: Internal Medicine

## 2015-09-14 DIAGNOSIS — Z681 Body mass index (BMI) 19 or less, adult: Secondary | ICD-10-CM

## 2015-09-14 DIAGNOSIS — N179 Acute kidney failure, unspecified: Secondary | ICD-10-CM

## 2015-09-14 DIAGNOSIS — N170 Acute kidney failure with tubular necrosis: Principal | ICD-10-CM | POA: Diagnosis present

## 2015-09-14 DIAGNOSIS — Z66 Do not resuscitate: Secondary | ICD-10-CM | POA: Diagnosis present

## 2015-09-14 DIAGNOSIS — F0390 Unspecified dementia without behavioral disturbance: Secondary | ICD-10-CM | POA: Diagnosis present

## 2015-09-14 DIAGNOSIS — N390 Urinary tract infection, site not specified: Secondary | ICD-10-CM | POA: Diagnosis present

## 2015-09-14 DIAGNOSIS — R4182 Altered mental status, unspecified: Secondary | ICD-10-CM

## 2015-09-14 DIAGNOSIS — I248 Other forms of acute ischemic heart disease: Secondary | ICD-10-CM | POA: Diagnosis present

## 2015-09-14 DIAGNOSIS — N400 Enlarged prostate without lower urinary tract symptoms: Secondary | ICD-10-CM | POA: Diagnosis present

## 2015-09-14 DIAGNOSIS — E43 Unspecified severe protein-calorie malnutrition: Secondary | ICD-10-CM | POA: Diagnosis present

## 2015-09-14 DIAGNOSIS — K219 Gastro-esophageal reflux disease without esophagitis: Secondary | ICD-10-CM | POA: Diagnosis present

## 2015-09-14 DIAGNOSIS — Z515 Encounter for palliative care: Secondary | ICD-10-CM | POA: Diagnosis present

## 2015-09-14 DIAGNOSIS — F0391 Unspecified dementia with behavioral disturbance: Secondary | ICD-10-CM | POA: Diagnosis present

## 2015-09-14 DIAGNOSIS — N183 Chronic kidney disease, stage 3 (moderate): Secondary | ICD-10-CM | POA: Diagnosis present

## 2015-09-14 DIAGNOSIS — D649 Anemia, unspecified: Secondary | ICD-10-CM | POA: Diagnosis present

## 2015-09-14 DIAGNOSIS — I129 Hypertensive chronic kidney disease with stage 1 through stage 4 chronic kidney disease, or unspecified chronic kidney disease: Secondary | ICD-10-CM | POA: Diagnosis present

## 2015-09-14 DIAGNOSIS — N189 Chronic kidney disease, unspecified: Secondary | ICD-10-CM

## 2015-09-14 DIAGNOSIS — M1990 Unspecified osteoarthritis, unspecified site: Secondary | ICD-10-CM | POA: Diagnosis present

## 2015-09-14 DIAGNOSIS — E860 Dehydration: Secondary | ICD-10-CM

## 2015-09-14 DIAGNOSIS — I10 Essential (primary) hypertension: Secondary | ICD-10-CM | POA: Diagnosis present

## 2015-09-14 DIAGNOSIS — I959 Hypotension, unspecified: Secondary | ICD-10-CM | POA: Diagnosis present

## 2015-09-14 DIAGNOSIS — R627 Adult failure to thrive: Secondary | ICD-10-CM | POA: Diagnosis present

## 2015-09-14 DIAGNOSIS — R778 Other specified abnormalities of plasma proteins: Secondary | ICD-10-CM

## 2015-09-14 DIAGNOSIS — W19XXXA Unspecified fall, initial encounter: Secondary | ICD-10-CM | POA: Diagnosis present

## 2015-09-14 DIAGNOSIS — R630 Anorexia: Secondary | ICD-10-CM

## 2015-09-14 DIAGNOSIS — Z87891 Personal history of nicotine dependence: Secondary | ICD-10-CM

## 2015-09-14 DIAGNOSIS — E039 Hypothyroidism, unspecified: Secondary | ICD-10-CM | POA: Diagnosis present

## 2015-09-14 DIAGNOSIS — R7989 Other specified abnormal findings of blood chemistry: Secondary | ICD-10-CM

## 2015-09-14 DIAGNOSIS — E870 Hyperosmolality and hypernatremia: Secondary | ICD-10-CM | POA: Diagnosis present

## 2015-09-14 DIAGNOSIS — E876 Hypokalemia: Secondary | ICD-10-CM | POA: Diagnosis present

## 2015-09-14 HISTORY — DX: Chronic kidney disease, stage 3 unspecified: N18.30

## 2015-09-14 HISTORY — DX: Gastro-esophageal reflux disease without esophagitis: K21.9

## 2015-09-14 HISTORY — DX: Chronic kidney disease, stage 3 (moderate): N18.3

## 2015-09-14 LAB — CBC WITH DIFFERENTIAL/PLATELET
BASOS ABS: 0.1 10*3/uL (ref 0–0.1)
BASOS PCT: 1 %
EOS ABS: 0.2 10*3/uL (ref 0–0.7)
EOS PCT: 3 %
HCT: 28.9 % — ABNORMAL LOW (ref 40.0–52.0)
Hemoglobin: 9.7 g/dL — ABNORMAL LOW (ref 13.0–18.0)
Lymphocytes Relative: 17 %
Lymphs Abs: 1.4 10*3/uL (ref 1.0–3.6)
MCH: 32.2 pg (ref 26.0–34.0)
MCHC: 33.7 g/dL (ref 32.0–36.0)
MCV: 95.6 fL (ref 80.0–100.0)
Monocytes Absolute: 0.5 10*3/uL (ref 0.2–1.0)
Monocytes Relative: 6 %
Neutro Abs: 5.8 10*3/uL (ref 1.4–6.5)
Neutrophils Relative %: 73 %
PLATELETS: 273 10*3/uL (ref 150–440)
RBC: 3.02 MIL/uL — AB (ref 4.40–5.90)
RDW: 19 % — ABNORMAL HIGH (ref 11.5–14.5)
WBC: 7.9 10*3/uL (ref 3.8–10.6)

## 2015-09-14 LAB — URINALYSIS COMPLETE WITH MICROSCOPIC (ARMC ONLY)
GLUCOSE, UA: NEGATIVE mg/dL
Leukocytes, UA: NEGATIVE
NITRITE: POSITIVE — AB
Protein, ur: 30 mg/dL — AB
SPECIFIC GRAVITY, URINE: 1.019 (ref 1.005–1.030)
Squamous Epithelial / LPF: NONE SEEN
pH: 5 (ref 5.0–8.0)

## 2015-09-14 LAB — COMPREHENSIVE METABOLIC PANEL
ALT: 17 U/L (ref 17–63)
AST: 28 U/L (ref 15–41)
Albumin: 3.4 g/dL — ABNORMAL LOW (ref 3.5–5.0)
Alkaline Phosphatase: 72 U/L (ref 38–126)
Anion gap: 12 (ref 5–15)
BILIRUBIN TOTAL: 4.9 mg/dL — AB (ref 0.3–1.2)
BUN: 75 mg/dL — AB (ref 6–20)
CO2: 28 mmol/L (ref 22–32)
CREATININE: 3.6 mg/dL — AB (ref 0.61–1.24)
Calcium: 9.1 mg/dL (ref 8.9–10.3)
Chloride: 123 mmol/L — ABNORMAL HIGH (ref 101–111)
GFR calc Af Amer: 15 mL/min — ABNORMAL LOW (ref >60)
GFR, EST NON AFRICAN AMERICAN: 13 mL/min — AB (ref >60)
Glucose, Bld: 107 mg/dL — ABNORMAL HIGH (ref 65–99)
Potassium: 3.5 mmol/L (ref 3.5–5.1)
Sodium: 163 mmol/L (ref 135–145)
TOTAL PROTEIN: 6.9 g/dL (ref 6.5–8.1)

## 2015-09-14 LAB — TROPONIN I
TROPONIN I: 0.16 ng/mL — AB (ref <0.031)
TROPONIN I: 0.14 ng/mL — AB (ref <0.031)
TROPONIN I: 0.17 ng/mL — AB (ref <0.031)
Troponin I: 0.15 ng/mL — ABNORMAL HIGH (ref <0.031)

## 2015-09-14 LAB — MRSA PCR SCREENING: MRSA by PCR: NEGATIVE

## 2015-09-14 LAB — MAGNESIUM: Magnesium: 2.3 mg/dL (ref 1.7–2.4)

## 2015-09-14 LAB — SODIUM
SODIUM: 163 mmol/L — AB (ref 135–145)
SODIUM: 161 mmol/L — AB (ref 135–145)
Sodium: 161 mmol/L (ref 135–145)

## 2015-09-14 MED ORDER — DEXTROSE 5 % IV SOLN
INTRAVENOUS | Status: DC
  Administered 2015-09-14: 09:00:00 via INTRAVENOUS

## 2015-09-14 MED ORDER — LEVOTHYROXINE SODIUM 125 MCG PO TABS
125.0000 ug | ORAL_TABLET | Freq: Every day | ORAL | Status: DC
  Administered 2015-09-14 – 2015-09-19 (×5): 125 ug via ORAL
  Filled 2015-09-14 (×6): qty 1

## 2015-09-14 MED ORDER — ONDANSETRON HCL 4 MG/2ML IJ SOLN: 4.0000 mg | Freq: Four times a day (QID) | INTRAMUSCULAR | Status: DC | PRN

## 2015-09-14 MED ORDER — SODIUM CHLORIDE 0.9 % IV SOLN
INTRAVENOUS | Status: DC
  Administered 2015-09-14: 05:00:00 via INTRAVENOUS

## 2015-09-14 MED ORDER — SODIUM CHLORIDE 0.9 % IJ SOLN
3.0000 mL | Freq: Two times a day (BID) | INTRAMUSCULAR | Status: DC
  Administered 2015-09-15 – 2015-09-18 (×6): 3 mL via INTRAVENOUS

## 2015-09-14 MED ORDER — SODIUM CHLORIDE 0.9 % IV SOLN
Freq: Once | INTRAVENOUS | Status: AC
  Administered 2015-09-14: 02:00:00 via INTRAVENOUS

## 2015-09-14 MED ORDER — HEPARIN SODIUM (PORCINE) 5000 UNIT/ML IJ SOLN
5000.0000 [IU] | Freq: Three times a day (TID) | INTRAMUSCULAR | Status: DC
  Administered 2015-09-14 – 2015-09-17 (×9): 5000 [IU] via SUBCUTANEOUS
  Filled 2015-09-14 (×10): qty 1

## 2015-09-14 MED ORDER — CETYLPYRIDINIUM CHLORIDE 0.05 % MT LIQD
7.0000 mL | Freq: Two times a day (BID) | OROMUCOSAL | Status: DC
  Administered 2015-09-14 – 2015-09-15 (×4): 7 mL via OROMUCOSAL

## 2015-09-14 MED ORDER — ACETAMINOPHEN 650 MG RE SUPP: 650.0000 mg | Freq: Four times a day (QID) | RECTAL | Status: DC | PRN

## 2015-09-14 MED ORDER — PANTOPRAZOLE SODIUM 40 MG PO TBEC
40.0000 mg | DELAYED_RELEASE_TABLET | Freq: Every day | ORAL | Status: DC
  Administered 2015-09-14 – 2015-09-18 (×4): 40 mg via ORAL
  Filled 2015-09-14 (×6): qty 1

## 2015-09-14 MED ORDER — ONDANSETRON HCL 4 MG PO TABS: 4.0000 mg | ORAL_TABLET | Freq: Four times a day (QID) | ORAL | Status: DC | PRN

## 2015-09-14 MED ORDER — DEXTROSE 5 % IV SOLN
1.0000 g | INTRAVENOUS | Status: DC
  Administered 2015-09-14: 1 g via INTRAVENOUS
  Filled 2015-09-14: qty 10

## 2015-09-14 MED ORDER — DEXTROSE 5 % IV SOLN: 1.0000 g | Freq: Once | INTRAVENOUS | Status: AC

## 2015-09-14 MED ORDER — HALOPERIDOL LACTATE 5 MG/ML IJ SOLN
INTRAMUSCULAR | Status: AC
Start: 2015-09-14 — End: 2015-09-14
  Administered 2015-09-14: 2.5 mg via INTRAMUSCULAR
  Filled 2015-09-14: qty 1

## 2015-09-14 MED ORDER — ACETAMINOPHEN 325 MG PO TABS: 650.0000 mg | ORAL_TABLET | Freq: Four times a day (QID) | ORAL | Status: DC | PRN

## 2015-09-14 MED ORDER — DEXTROSE 5 % IV SOLN
1.0000 g | INTRAVENOUS | Status: DC
  Administered 2015-09-14 – 2015-09-18 (×5): 1 g via INTRAVENOUS
  Filled 2015-09-14 (×8): qty 10

## 2015-09-14 MED ORDER — HALOPERIDOL LACTATE 5 MG/ML IJ SOLN
2.5000 mg | Freq: Once | INTRAMUSCULAR | Status: AC
  Administered 2015-09-14: 2.5 mg via INTRAMUSCULAR

## 2015-09-14 NOTE — ED Notes (Signed)
Haldol administered as ordered by Dr. Karma Greaser. Pt still very agitated and reports that he needs to use bathroom. Provided pt with urinal and attempted to assist pt with use - pt refuses assistance and uses urinal by self. Pt urinates approximately 83mL dark brown urine. Pt has removed all monitoring devices. Dr. Karma Greaser made aware of same. Per Dr. Karma Greaser, ok to wait until pt is more calm before attempting to replace monitoring devices. Skin tear noted to pt's left ear where pt removed pulse oximeter. Pt is now more calm, resting quietly on stretcher with eyes open. Pt's daughter at bedside. Sitter at bedside at this time. Will continue to monitor.

## 2015-09-14 NOTE — ED Notes (Signed)
Pt presents to ED via EMS from nursing facility. Per EMS, pt was discharged in the last day from the hospital after being admitted for dehydration since 12/5. Per EMS, pt fell tonight while trying to get to bathroom. Pt denies pain. Pt reports that he is not feeling well, notes that he feels weak. Per facility staff, pt was ambulatory prior to hospital admission and now is noted to have increased difficulty with ambulation. Pt has history of dementia. Garbled speech at baseline. No acute distress noted at this time. Will continue to monitor.

## 2015-09-14 NOTE — ED Notes (Signed)
Pt discussed with Dr. Karma Greaser. MD made aware of pt's low BP (89/61). VORB 530mL NS bolus. Order to be carried out by this RN. Tammy, ED medic at bedside to attempt blood draw at this time. Will continue to monitor.

## 2015-09-14 NOTE — Progress Notes (Signed)
Called MD Bridgett Larsson re pt's NA serum is 163, pt is receiving NS @ 75 ml/hr infusion,  Change to D5 @ 75 ml/hr per MD Bridgett Larsson

## 2015-09-14 NOTE — ED Notes (Signed)
Dr. Jannifer Franklin to bedside at this time.

## 2015-09-14 NOTE — ED Notes (Signed)
Dr. Karma Greaser to bedside at this time.

## 2015-09-14 NOTE — ED Provider Notes (Signed)
[facility] Emergency Department Provider Note  ____________________________________________  Time seen: Approximately 2:38 AM  I have reviewed the triage vital signs and the nursing notes.   HISTORY  Chief Complaint Fall  History limited by chronic dementia and acute delirium  HPI Bernard [name] is a 81 y.o. male with extensive past medical history who was just released from rehabilitation yesterday to an assisted living facility who presents by EMS for weakness, recent fall, acute on chronic altered mental status (delirium), and failure to thrive.  His daughter reports that he has not been eating or drinking well recently and he is getting increasingly more weak.  This is been a gradual onset but consistent since his last hospitalization.  His speech is garbled at baseline and is difficult to determine what he is saying but he has had no apparent recent chest pain or shortness of breath nor nausea and vomiting.  The biggest concern is that he has not been eating and drinking well and that he had a fall today with no apparent injury likely due to increasing generalized weakness.    Past Medical History  Diagnosis Date  . Hypothyroidism   . Arthritis   . HTN (hypertension)   . Resting tremor   . GERD (gastroesophageal reflux disease)   . CKD (chronic kidney disease), stage III     Patient Active Problem List   Diagnosis Date Noted  . Hypernatremia 09/14/2015  . Elevated troponin 09/14/2015  . Hypothyroidism 09/14/2015  . HTN (hypertension) 09/14/2015  . GERD (gastroesophageal reflux disease) 09/14/2015  . UTI (lower urinary tract infection) 09/14/2015  . Dementia 09/14/2015  . Protein-calorie malnutrition, severe 08/25/2015  . Acute-on-chronic kidney injury ([place]) 08/24/2015  . PULMONARY FIBROSIS 08/23/2008    Past Surgical History  Procedure Laterality Date  . Appendectomy    . Cholecystectomy      Current Outpatient Rx  Name  Route  Sig   Dispense  Refill  . acetaminophen (TYLENOL) 325 MG tablet   Oral   Take 2 tablets (650 mg total) by mouth every 6 (six) hours as needed for mild pain (or Fever >/= 101).         . feeding supplement, ENSURE ENLIVE, (ENSURE ENLIVE) LIQD   Oral   Take 237 mLs by mouth 2 (two) times daily between meals.   237 mL   12   . furosemide (LASIX) 40 MG tablet   Oral   Take 0.5 tablets (20 mg total) by mouth daily.   30 tablet   0   . levothyroxine (SYNTHROID, LEVOTHROID) 125 MCG tablet   Oral   Take 125 mcg by mouth daily before breakfast.         . Multiple Vitamin (MULTIVITAMIN) tablet   Oral   Take 1 tablet by mouth daily.         . ondansetron (ZOFRAN) 4 MG tablet   Oral   Take 1 tablet (4 mg total) by mouth every 6 (six) hours as needed for nausea.   20 tablet   0   . oxyCODONE (OXY IR/ROXICODONE) 5 MG immediate release tablet   Oral   Take 1 tablet (5 mg total) by mouth every 4 (four) hours as needed for moderate pain.   30 tablet   0   . pantoprazole (PROTONIX) 40 MG tablet   Oral   Take 1 tablet (40 mg total) by mouth daily before breakfast.   30 tablet   0   . vitamin B-12 (CYANOCOBALAMIN)  100 MCG tablet   Oral   Take 100 mcg by mouth daily.           Allergies Review of patient's allergies indicates no known allergies.  Family History  Problem Relation Age of Onset  . Pneumonia    . Emphysema    . Cancer      Social History Social History  Substance Use Topics  . Smoking status: Former Smoker -- 2.00 packs/day for 25 years    Quit date: 03/02/1970  . Smokeless tobacco: Not on file  . Alcohol Use: No    Review of Systems Unable to obtain due to acute delirium in addition to chronic dementia  ____________________________________________   PHYSICAL EXAM:  VITAL SIGNS: ED Triage Vitals  Enc Vitals Group     BP 09/14/15 0026 104/78 mmHg     Pulse Rate 09/14/15 0026 97     Resp 09/14/15 0026 20     Temp 09/14/15 0026 98.4 F (36.9  C)     Temp Source 09/14/15 0026 Rectal     SpO2 09/14/15 0100 100 %     Weight 09/14/15 0026 120 lb 13 oz (54.8 kg)     Height --      Head Cir --      Peak Flow --      Pain Score --      Pain Loc --      Pain Edu? --      Excl. in [place]? --     Constitutional: Awake, unable to assess orientation, very agitated, pulling off leads and trying to get out of bed.  Cachectic Eyes: Conjunctivae are normal. PERRL. EOMI. Head: Atraumatic. Nose: No congestion/rhinnorhea. Mouth/Throat: Mucous membranes are dry.   Neck: No stridor.  No cervical spine tenderness to palpation. Cardiovascular: Borderline tachycardia, regular rhythm. Grossly normal heart sounds.  Good peripheral circulation. Respiratory: Normal respiratory effort.  No retractions. Lungs CTAB. Gastrointestinal: Soft and nontender.  Cachectic.  No distention. No abdominal bruits. No CVA tenderness. Musculoskeletal: No lower extremity tenderness nor edema.  No joint effusions. Neurologic:  Speech is garbled at baseline. No gross focal neurologic deficits are appreciated.  Moving all 4 extremities Skin:  Skin is warm, dry and intact. No rash noted.   ____________________________________________   LABS (all labs ordered are listed, but only abnormal results are displayed)  Labs Reviewed  COMPREHENSIVE METABOLIC PANEL - Abnormal; Notable for the following:    Sodium 163 (*)    Chloride 123 (*)    Glucose, Bld 107 (*)    BUN 75 (*)    Creatinine, Ser 3.60 (*)    Albumin 3.4 (*)    Total Bilirubin 4.9 (*)    GFR calc non Af Amer 13 (*)    GFR calc Af Amer 15 (*)    All other components within normal limits  CBC WITH DIFFERENTIAL/PLATELET - Abnormal; Notable for the following:    RBC 3.02 (*)    Hemoglobin 9.7 (*)    HCT 28.9 (*)    RDW 19.0 (*)    All other components within normal limits  TROPONIN I - Abnormal; Notable for the following:    Troponin I 0.16 (*)    All other components within normal limits  URINALYSIS  COMPLETEWITH MICROSCOPIC ([facility] ONLY) - Abnormal; Notable for the following:    Color, Urine AMBER (*)    APPearance HAZY (*)    Bilirubin Urine 1+ (*)    Ketones, ur TRACE (*)  Hgb urine dipstick 1+ (*)    Protein, ur 30 (*)    Nitrite POSITIVE (*)    Bacteria, UA FEW (*)    All other components within normal limits  URINE CULTURE  MAGNESIUM   ____________________________________________  EKG  ED ECG REPORT I, Yennifer Segovia, the attending physician, personally viewed and interpreted this ECG.  Date: 09/14/2015 EKG Time: 01:38 Rate: 94 Rhythm: normal sinus rhythm QRS Axis: normal Intervals: normal ST/T Wave abnormalities: Non-specific ST segment / T-wave changes, but no evidence of acute ischemia. Conduction Disutrbances: none Narrative Interpretation: unremarkable  ____________________________________________  RADIOLOGY   No results found.  ____________________________________________   PROCEDURES  Procedure(s) performed: None  Critical Care performed: No ____________________________________________   INITIAL IMPRESSION / ASSESSMENT AND PLAN / ED COURSE  Pertinent labs & imaging results that were available during my care of the patient were reviewed by me and considered in my medical decision making (see chart for details).  Severe hypernatremia and acute on chronic renal failure likely due to dehydration.  Agitated and presenting a danger to himself, we administered Haldol 2.5 mg IM for the patient's safety.  I provided a 500 mL normal saline IV bolus followed by a rate.  I am admitting for further management.  The daughter agrees with the plan.  ____________________________________________  FINAL CLINICAL IMPRESSION(S) / ED DIAGNOSES  Final diagnoses:  Acute renal failure, unspecified acute renal failure type ([place])  Dehydration  Hypernatremia  Altered mental status, unspecified altered mental status type  Elevated troponin I level  Complicated  UTI    NEW MEDICATIONS STARTED DURING THIS VISIT:  New Prescriptions   No medications on file     Hinda Kehr, MD 09/14/15 [phone]

## 2015-09-14 NOTE — Progress Notes (Signed)
[place] at [place] NAME: Bernard [name]    MR#:  [id number]  DATE OF BIRTH:  1918-09-27  SUBJECTIVE:  CHIEF COMPLAINT:   Chief Complaint  Patient presents with  . Fall   The patient is demented and noncommunicative. REVIEW OF SYSTEMS:  Unable to obtain.  DRUG ALLERGIES:  No Known Allergies  VITALS:  Blood pressure 99/57, pulse 74, temperature 97.8 F (36.6 C), temperature source Axillary, resp. rate 20, height 5\' 8"  (1.727 m), weight 54.8 kg (120 lb 13 oz), SpO2 90 %.  PHYSICAL EXAMINATION:  GENERAL:  81 y.o.-year-old patient lying in the bed with lethargy. EYES: Pupils equal, round, reactive to light and accommodation. No scleral icterus.  HEENT: Head atraumatic, normocephalic. NECK:  Supple, no jugular venous distention. No thyroid enlargement, no tenderness.  LUNGS: Normal breath sounds bilaterally, no wheezing, rales,rhonchi or crepitation. No use of accessory muscles of respiration.  CARDIOVASCULAR: S1, S2 normal. No murmurs, rubs, or gallops.  ABDOMEN: Soft, nontender, nondistended. Bowel sounds present. No organomegaly or mass.  EXTREMITIES: No pedal edema, cyanosis, or clubbing.  NEUROLOGIC: Unable to exam.  PSYCHIATRIC: The patient is demented and noncommunicative. SKIN: No obvious rash, lesion, or ulcer.    LABORATORY PANEL:   CBC  Recent Labs Lab 09/14/15 0145  WBC 7.9  HGB 9.7*  HCT 28.9*  PLT 273   ------------------------------------------------------------------------------------------------------------------  Chemistries   Recent Labs Lab 09/14/15 0145  09/14/15 1105  NA 163*  < > 161*  K 3.5  --   --   CL 123*  --   --   CO2 28  --   --   GLUCOSE 107*  --   --   BUN 75*  --   --   CREATININE 3.60*  --   --   CALCIUM 9.1  --   --   MG 2.3  --   --   AST 28  --   --   ALT 17  --   --   ALKPHOS 72  --   --   BILITOT 4.9*  --   --   < > = values in this interval not  displayed. ------------------------------------------------------------------------------------------------------------------  Cardiac Enzymes  Recent Labs Lab 09/14/15 1105  TROPONINI 0.15*   ------------------------------------------------------------------------------------------------------------------  RADIOLOGY:  No results found.  EKG:   Orders placed or performed during the hospital encounter of 09/14/15  . ED EKG  . ED EKG  . EKG 12-Lead  . EKG 12-Lead    ASSESSMENT AND PLAN:   Hypernatremia , due to profound dehydration.  hydrated with normal saline, since sodium is still high at 163, change to D5 IV. Follow up BMP.  Acute-on-chronic kidney injury ([place]) - due to poor by mouth intake and UTI. Continue D5 IV and follow-up BMP. Nephrology consult.  UTI (lower urinary tract infection) - nitrite-positive urine. Continue Rocephin and follow-up urine culture.   Elevated troponin - likely due to demand ischemia and renal failure.   HTN (hypertension) - borderline hypotensive, hold all antihypertensives for now.   Dementia - baseline is declining per patient's daughter. Fairly communicative at baseline, though he does not typically recognizes most people.   Hypothyroidism - continue home dose thyroid replacement   All the records are reviewed and case discussed with Care Management/Social Workerr. Management plans discussed with the patient, family and they are in agreement.  CODE STATUS: Full code  TOTAL TIME TAKING CARE OF THIS PATIENT: 46 minutes.  Greater than  50% time was spent on coordination of care and face-to-face counseling.  POSSIBLE D/C IN >3 DAYS, DEPENDING ON CLINICAL CONDITION.   Demetrios Loll M.D on 09/14/2015 at 3:14 PM  Between 7am to 6pm - Pager - [phone]  After 6pm go to [email or web address] - password [email or web address] [facility]  [facility]  Office  [phone]  CC: Primary care physician; No primary care provider on file.

## 2015-09-14 NOTE — ED Notes (Signed)
Pt becoming very agitated at this time. MD at bedside with this RN and Apolonio Schneiders, RN.

## 2015-09-14 NOTE — Progress Notes (Signed)
Patient arrived to 2A Room 244. Patient denies pain and all questions from daughter answered. Patient oriented to unit and Fall Safety Plan not signed on admission due to patient's mental status. Skin assessment completed with Andria Meuse RN. Abrasion on R posterior shoulder and redness on R interior knee. Skin tear noted on L ear from patient pulling off pulse ox in ED. Nursing staff will continue to monitor. Earleen Reaper, RN

## 2015-09-14 NOTE — H&P (Signed)
[place] at [place] NAME: Bernard [name]    MR#:  [id number]  DATE OF BIRTH:  August 25, 1919  DATE OF ADMISSION:  09/14/2015  PRIMARY CARE PHYSICIAN: No primary care provider on file.   REQUESTING/REFERRING PHYSICIAN: Karma Greaser, M.D.  CHIEF COMPLAINT:   Chief Complaint  Patient presents with  . Fall    HISTORY OF PRESENT ILLNESS:  Bernard [name]  is a 81 y.o. male who presents with fall and generalized weakness. Patient was admitted to Hospital several weeks ago, and discharged to rehabilitation center where he has been up until this point. He was growing generally more weak rehabilitation facility, and then had a fall earlier today which prompted being sent to the ED for evaluation. Patient is altered from his baseline mental status, which per his daughter, who provides history today, has been declining recently. In the ED was found to have a sodium of 163, nitrite positive UA, and a troponin of 0.16. For all of this hospitalists were called for admission.  PAST MEDICAL HISTORY:   Past Medical History  Diagnosis Date  . Hypothyroidism   . Arthritis   . HTN (hypertension)   . Resting tremor   . GERD (gastroesophageal reflux disease)   . CKD (chronic kidney disease), stage III     PAST SURGICAL HISTORY:   Past Surgical History  Procedure Laterality Date  . Appendectomy    . Cholecystectomy      SOCIAL HISTORY:   Social History  Substance Use Topics  . Smoking status: Former Smoker -- 2.00 packs/day for 25 years    Quit date: 03/02/1970  . Smokeless tobacco: Not on file  . Alcohol Use: No    FAMILY HISTORY:   Family History  Problem Relation Age of Onset  . Pneumonia    . Emphysema    . Cancer      DRUG ALLERGIES:  No Known Allergies  MEDICATIONS AT HOME:   Prior to Admission medications   Medication Sig Start Date End Date Taking? Authorizing Provider  acetaminophen (TYLENOL) 325 MG tablet Take 2 tablets (650  mg total) by mouth every 6 (six) hours as needed for mild pain (or Fever >/= 101). 08/27/15  Yes Nicholes Mango, MD  feeding supplement, ENSURE ENLIVE, (ENSURE ENLIVE) LIQD Take 237 mLs by mouth 2 (two) times daily between meals. 08/27/15  Yes Nicholes Mango, MD  furosemide (LASIX) 40 MG tablet Take 0.5 tablets (20 mg total) by mouth daily. 08/27/15  Yes Nicholes Mango, MD  levothyroxine (SYNTHROID, LEVOTHROID) 125 MCG tablet Take 125 mcg by mouth daily before breakfast.   Yes Historical Provider, MD  Multiple Vitamin (MULTIVITAMIN) tablet Take 1 tablet by mouth daily.   Yes Historical Provider, MD  ondansetron (ZOFRAN) 4 MG tablet Take 1 tablet (4 mg total) by mouth every 6 (six) hours as needed for nausea. 08/27/15  Yes Nicholes Mango, MD  oxyCODONE (OXY IR/ROXICODONE) 5 MG immediate release tablet Take 1 tablet (5 mg total) by mouth every 4 (four) hours as needed for moderate pain. 08/27/15  Yes Nicholes Mango, MD  pantoprazole (PROTONIX) 40 MG tablet Take 1 tablet (40 mg total) by mouth daily before breakfast. 08/27/15  Yes Nicholes Mango, MD  vitamin B-12 (CYANOCOBALAMIN) 100 MCG tablet Take 100 mcg by mouth daily.   Yes Historical Provider, MD    REVIEW OF SYSTEMS:  Review of Systems  Unable to perform ROS: dementia     VITAL SIGNS:   Filed Vitals:  09/14/15 0026 09/14/15 0100 09/14/15 0136 09/14/15 0150  BP: 104/78 93/60 89/61  94/67  Pulse: 97 91 92 91  Temp: 98.4 F (36.9 C)     TempSrc: Rectal     Resp: 20 14 15 18   Weight: 54.8 kg (120 lb 13 oz)     SpO2:  100% 100% 100%   Wt Readings from Last 3 Encounters:  09/14/15 54.8 kg (120 lb 13 oz)  08/24/15 60.691 kg (133 lb 12.8 oz)  08/23/08 83.575 kg (184 lb 4 oz)    PHYSICAL EXAMINATION:  Physical Exam  Vitals reviewed. Constitutional: He appears well-developed. No distress.  HENT:  Head: Normocephalic and atraumatic.  Very dry mucous membranes  Eyes: Conjunctivae and EOM are normal. Pupils are equal, round, and reactive to light. No  scleral icterus.  Neck: Normal range of motion. Neck supple. No JVD present. No thyromegaly present.  Cardiovascular: Normal rate, regular rhythm and intact distal pulses.  Exam reveals no gallop and no friction rub.   No murmur heard. Respiratory: Effort normal and breath sounds normal. No respiratory distress. He has no wheezes. He has no rales.  GI: Soft. Bowel sounds are normal. He exhibits no distension. There is no tenderness.  Musculoskeletal: Normal range of motion. He exhibits no edema.  No arthritis, no gout  Lymphadenopathy:    He has no cervical adenopathy.  Neurological:  Patient not very verbally responsive. Unable to fully assess due to patient's condition  Skin: Skin is warm and dry. No rash noted. No erythema.  Skin tear on his shoulder and also his left ear lobe.  Psychiatric:  Unable to assess due to patient's condition    LABORATORY PANEL:   CBC  Recent Labs Lab 09/14/15 0145  WBC 7.9  HGB 9.7*  HCT 28.9*  PLT 273   ------------------------------------------------------------------------------------------------------------------  Chemistries   Recent Labs Lab 09/14/15 0145  NA 163*  K 3.5  CL 123*  CO2 28  GLUCOSE 107*  BUN 75*  CREATININE 3.60*  CALCIUM 9.1  MG 2.3  AST 28  ALT 17  ALKPHOS 72  BILITOT 4.9*   ------------------------------------------------------------------------------------------------------------------  Cardiac Enzymes  Recent Labs Lab 09/14/15 0145  TROPONINI 0.16*   ------------------------------------------------------------------------------------------------------------------  RADIOLOGY:  No results found.  EKG:   Orders placed or performed during the hospital encounter of 09/14/15  . ED EKG  . ED EKG  . EKG 12-Lead  . EKG 12-Lead    IMPRESSION AND PLAN:  Principal Problem:   UTI (lower urinary tract infection) - nitrite-positive urine. IV Rocephin given in the ED, urine culture sent. This is very  likely a significant contributor to his weakness and decreased by mouth intake which has caused his other problems as below. Active Problems:   Acute-on-chronic kidney injury ([place]) - due to poor by mouth intake and UTI as above. Hydrate for now with normal saline and monitor closely. Avoid nephrotoxins. Creatinine is risen to greater than 3. If he does not improve will need to consult nephrology.   Hypernatremia - due to profound dehydration. We'll hydrate with normal saline for now. If his sodium is not correcting will need to use hypotonic fluid.   Elevated troponin - likely due to demand ischemia and his profound dehydration. However, we'll trend serial enzymes. If it rises significantly we will treat for primary cardiac process.   HTN (hypertension) - borderline hypotensive at this time. We'll hold all antihypertensives for now.   Dementia - baseline is declining per patient's daughter. Fairly communicative at  baseline, though he does not typically recognizes most people.   Hypothyroidism - continue home dose thyroid replacement   GERD (gastroesophageal reflux disease) - home dose PPI  All the records are reviewed and case discussed with ED provider. Management plans discussed with the patient and/or family.  DVT PROPHYLAXIS: Subcutaneous heparin  GI PROPHYLAXIS: PPI  ADMISSION STATUS: Inpatient  CODE STATUS: DO NOT RESUSCITATE  TOTAL TIME TAKING CARE OF THIS PATIENT: 45 minutes.    Cristan Scherzer [place] 09/14/2015, 3:36 AM  [facility]  Office  [phone]  CC: Primary care physician; No primary care provider on file.

## 2015-09-14 NOTE — Progress Notes (Signed)
ANTIBIOTIC CONSULT NOTE - INITIAL  Pharmacy Consult for ceftriaxone Indication: UTI  No Known Allergies  Patient Measurements: Weight: 120 lb 13 oz (54.8 kg) Adjusted Body Weight:   Vital Signs: Temp: 98.4 F (36.9 C) (12/25 0026) Temp Source: Rectal (12/25 0026) BP: 94/67 mmHg (12/25 0150) Pulse Rate: 91 (12/25 0150) Intake/Output from previous day:   Intake/Output from this shift:    Labs:  Recent Labs  09/14/15 0145  WBC 7.9  HGB 9.7*  PLT 273  CREATININE 3.60*   Estimated Creatinine Clearance: 9.7 mL/min (by C-G formula based on Cr of 3.6). No results for input(s): VANCOTROUGH, VANCOPEAK, VANCORANDOM, GENTTROUGH, GENTPEAK, GENTRANDOM, TOBRATROUGH, TOBRAPEAK, TOBRARND, AMIKACINPEAK, AMIKACINTROU, AMIKACIN in the last 72 hours.   Microbiology: Recent Results (from the past 720 hour(s))  MRSA PCR Screening     Status: None   Collection Time: 08/24/15 11:49 PM  Result Value Ref Range Status   MRSA by PCR NEGATIVE NEGATIVE Final    Comment:        The GeneXpert MRSA Assay (FDA approved for NASAL specimens only), is one component of a comprehensive MRSA colonization surveillance program. It is not intended to diagnose MRSA infection nor to guide or monitor treatment for MRSA infections.     Medical History: Past Medical History  Diagnosis Date  . Hypothyroidism   . Arthritis   . HTN (hypertension)   . Resting tremor   . GERD (gastroesophageal reflux disease)   . CKD (chronic kidney disease), stage III     Medications:  Infusions:  . cefTRIAXone (ROCEPHIN)  IV     Assessment: 89 yom cc fall/dehydration. Pt feels weak. History of dementia, agitated in ED. Pharmacy consulted to dose ceftriaxone for UTI.  Goal of Therapy:    Plan:  Ceftriaxone 1 gm IV Q24H. Pharmacy will continue to follow.  Laural Benes, Pharm.D., BCPS Clinical Pharmacist 09/14/2015,3:46 AM

## 2015-09-14 NOTE — ED Notes (Signed)
Dr. Karma Greaser notified of critical lab values.  Sodium - 163; Chloride - 123;           Troponin - 0.16.  Dr. Karma Greaser acknowledged, no new orders.

## 2015-09-15 LAB — BASIC METABOLIC PANEL
Anion gap: 8 (ref 5–15)
BUN: 70 mg/dL — AB (ref 6–20)
CHLORIDE: 120 mmol/L — AB (ref 101–111)
CO2: 31 mmol/L (ref 22–32)
CREATININE: 3.17 mg/dL — AB (ref 0.61–1.24)
Calcium: 8.7 mg/dL — ABNORMAL LOW (ref 8.9–10.3)
GFR, EST AFRICAN AMERICAN: 18 mL/min — AB (ref >60)
GFR, EST NON AFRICAN AMERICAN: 15 mL/min — AB (ref >60)
Glucose, Bld: 103 mg/dL — ABNORMAL HIGH (ref 65–99)
Potassium: 2.6 mmol/L — CL (ref 3.5–5.1)
SODIUM: 159 mmol/L — AB (ref 135–145)

## 2015-09-15 LAB — URINE CULTURE

## 2015-09-15 LAB — SODIUM: SODIUM: 159 mmol/L — AB (ref 135–145)

## 2015-09-15 MED ORDER — ENSURE ENLIVE PO LIQD: 237.0000 mL | Freq: Two times a day (BID) | ORAL | Status: DC

## 2015-09-15 MED ORDER — POTASSIUM CHLORIDE 10 MEQ/100ML IV SOLN
10.0000 meq | INTRAVENOUS | Status: AC
  Administered 2015-09-15 (×6): 10 meq via INTRAVENOUS
  Filled 2015-09-15 (×7): qty 100

## 2015-09-15 MED ORDER — DEXTROSE 5 % IV SOLN
INTRAVENOUS | Status: DC
  Administered 2015-09-15 – 2015-09-19 (×6): via INTRAVENOUS
  Filled 2015-09-15 (×13): qty 1000

## 2015-09-15 MED ORDER — ENSURE ENLIVE PO LIQD
237.0000 mL | Freq: Three times a day (TID) | ORAL | Status: DC
  Administered 2015-09-16 – 2015-09-18 (×5): 237 mL via ORAL

## 2015-09-15 NOTE — Progress Notes (Signed)
MD, Jannifer Franklin aware of Potasium level of 2.6 this morning. Requested Dr. To change diet from regular to soft. Orders were placed by Dr. Aletha Halim continue to monitor.

## 2015-09-15 NOTE — Progress Notes (Signed)
Patient SBP 88, Dr. Bridgett Larsson notified. No new orders at this time. Patient asymptomatic, all other vss. Will reassess. Wilnette Kales

## 2015-09-15 NOTE — Care Management (Signed)
Patient is confused apparently as he is close to the nurses station and has mittens on hands. He is from the [facility]. They have PT on site through [facility]. If patient needs PT at discharge back to ALF order should say Outpatient PT unless nursing is also needed- then home health will be arranged by [facility]. RNCM will assist with discharge planning if necessary.

## 2015-09-15 NOTE — Clinical Social Work Note (Addendum)
Clinical Social Work Assessment  Patient Details  Name: Filippo Puls MRN: [id number] Date of Birth: 04-15-1919  Date of referral:  09/15/15               Reason for consult:  Facility Placement, Other (Comment Required) (From- [facility])                Permission sought to share information with:  Facility Sport and exercise psychologist, Guardian, Family Supports Permission granted to share information::  Yes, Verbal Permission Granted  Name::       [facility]  Agency::   Vuk Skillern- [place]/ POA [phone])  Relationship::   Ulices Maack- [place]/ POA [phone])  Contact Information:   [facility] [phone]  Housing/Transportation Living arrangements for the past 2 months:  [place] ([facility]) Source of Information:  Adult Children, [place], Outpatient Provider, Other (Comment Required) Nazario Russom- [place]/ POA [phone];  [facility]; [place]; [place]) Patient Interpreter Needed:  None Criminal Activity/Legal Involvement Pertinent to Current Situation/Hospitalization:  No - Comment as needed Significant Relationships:  Adult Children, Other Family Members Lives with:  Facility Resident ( [facility]) Do you feel safe going back to the place where you live?  Yes Need for family participation in patient care:  Yes (Comment) Trenell Concannon- [place]/ POA [phone])  Care giving concerns: Patient was admitted to [facility] from [facility] due to a fall and generalized weakness.    Social Worker assessment / plan:  CSW was consulted by patient's RN because patient was admitted from a facility. CSW met with patient, daughter Leonel Mccollum [phone] and Granddaughter Melissa at bedside. Patient was lying in the bed. Patient was alert and oriented to self.Malachy Mood, patient's daughter informed CSW that she's patient's POA. Malachy Mood,  patient's daughter reports that patient is a readmit. She reports that he's from [facility] where patient is private pay. She reports that during patients last admission into [facility] he was admitted into [facility] where he spent "about two weeks". She stated that patient "used all of his days" at [facility]. She reports that patient returned to the [facility] and was supposed to be accompanied by hospice services. She reports that she's unsure of the agency that was supposed to provide hospice care but she believes it was supposed to start "today or tomorrow". CSW inquired about discharge plans. Malachy Mood reports that the discharge plan is for patient to return to [facility] after discharge with Hospice.   CSW contacted [facility]. Per Magda Paganini, Admissions coordinator at [facility] patient was admitted into their facility from 08/27/2015- 09/12/2015. She reports that he "ran out of days" and his insurance would not approve for any additional rehab days.   CSW contacted the [facility]. Per Carmon Sails patient has been a resident since 07/17/2015. She reports "I think patient walks on his own". She stated that patient isn't receiving any PT while at [facility]. She report the number to fax patient's information at discharge is [phone].  CSW contacted [facility] to determine if they began seeing patient after his discharge from [facility]. Per the triage nurse she received a referral for patient but had not set any services up due to the holiday. She reports that hospice services will begin once patient is discharged back  to [facility].   FL2 completed.   Employment status:  Retired Forensic scientist:  Medicare PT Recommendations:  Not assessed at this time Information / Referral to community resources:   (None)  Patient/Family's Response to care: Patient's daughter/ POA, Malachy Mood is  agreeable to patient returning to [facility] at discharge.   Patient/Family's Understanding of and Emotional Response to Diagnosis, Current Treatment, and Prognosis: Patient was alert and oriented to self. Patient's daughter/ POA Malachy Mood was appreciative of CSW assistance.   Emotional Assessment Appearance:  Appears stated age Attitude/Demeanor/Rapport:   (None) Affect (typically observed):  Calm Orientation:  Oriented to Self Alcohol / Substance use:  Tobacco Use (Former Smoker; Quit date: 03/02/1970) Psych involvement (Current and /or in the community):  No (Comment)  Discharge Needs  Concerns to be addressed:  Discharge Planning Concerns ([facility]; [place]; [place]) Readmission within the last 30 days:  Yes Current discharge risk:  Dependent with Mobility, Chronically ill Barriers to Discharge:  No Barriers Identified   [place], LCSW 09/15/2015, 12:17 PM

## 2015-09-15 NOTE — Progress Notes (Signed)
Initial Nutrition Assessment  DOCUMENTATION CODES:    Pt with severe malnutrition in the setting of chronic illness on 08/25/2015.   INTERVENTION:   Meals and Snacks: Cater to patient preferences. Medical Food Supplement Therapy: will recommend Ensure Enlive po TID, each supplement provides 350 kcal and 20 grams of protein Coordination of Care: if pt at risk for aspiration, recommend SLP evaluation.   NUTRITION DIAGNOSIS:   Inadequate oral intake related to acute illness as evidenced by meal completion < 25%.  GOAL:   Patient will meet greater than or equal to 90% of their needs  MONITOR:    (Energy Intake, Anthropometrics, Electrolyte and Renal Profile)  REASON FOR ASSESSMENT:   Malnutrition Screening Tool    ASSESSMENT:   Pt admitted with UTI. Pt with severe dementia with recent admission roughly 3 weeks ago. Pt with poor po intake, refusing to eat PTA per Nephrology note; all pt's family does not want aggressive care via feeding tube per Nephrology note. Pt currently with 1:1 sitter and mittens to prevent pulling of lines.  Past Medical History  Diagnosis Date  . Hypothyroidism   . Arthritis   . HTN (hypertension)   . Resting tremor   . GERD (gastroesophageal reflux disease)   . CKD (chronic kidney disease), stage III      Diet Order:  DIET SOFT Room service appropriate?: Yes; Fluid consistency:: Thin    Current Nutrition: Per RN Brandi pt ate one bite of grits this am with sip of orange juice with pills.  Food/Nutrition-Related History: Per MD note poor po intake PTA. No family present on visit. Pt calm and sleeping. RD notes per MD note, pt relatively noncommunicative.    Scheduled Medications:  . antiseptic oral rinse  7 mL Mouth Rinse BID  . cefTRIAXone (ROCEPHIN)  IV  1 g Intravenous Q24H  . feeding supplement (ENSURE ENLIVE)  237 mL Oral TID WC  . heparin  5,000 Units Subcutaneous 3 times per day  . levothyroxine  125 mcg Oral QAC breakfast  .  pantoprazole  40 mg Oral QAC breakfast  . sodium chloride  3 mL Intravenous Q12H    Continuous Medications:  . dextrose 5 % with kcl 75 mL/hr at 09/15/15 1237     Electrolyte/Renal Profile and Glucose Profile:   Recent Labs Lab 09/14/15 0145  09/14/15 1731 09/14/15 2337 09/15/15 0514  NA 163*  < > 161* 159* 159*  K 3.5  --   --   --  2.6*  CL 123*  --   --   --  120*  CO2 28  --   --   --  31  BUN 75*  --   --   --  70*  CREATININE 3.60*  --   --   --  3.17*  CALCIUM 9.1  --   --   --  8.7*  MG 2.3  --   --   --   --   GLUCOSE 107*  --   --   --  103*  < > = values in this interval not displayed. Protein Profile:   Recent Labs Lab 09/14/15 0145  ALBUMIN 3.4*    Gastrointestinal Profile: Last BM:  09/15/2015   Nutrition-Focused Physical Exam Findings:  Unable to complete Nutrition-Focused physical exam at this time. RD notes pt with moderate-severe fat and muscle depletion documented on 08/25/2015.   Weight Change: Pt weight weight loss of 10% weight loss since the beginning of this month (12/4)  Height:   Ht Readings from Last 1 Encounters:  09/14/15 5\' 8"  (1.727 m)    Weight:   Wt Readings from Last 1 Encounters:  09/14/15 120 lb 13 oz (54.8 kg)    Wt Readings from Last 10 Encounters:  09/14/15 120 lb 13 oz (54.8 kg)  08/24/15 133 lb 12.8 oz (60.691 kg)  08/23/08 184 lb 4 oz (83.575 kg)     Ideal Body Weight:   70kg  BMI:  Body mass index is 18.37 kg/(m^2).  Estimated Nutritional Needs:   Kcal:  using IBW of 70kg, BEE: 1310kcals, TEE: (IF 1.1-1.3)(AF 1.2) 1728-2049kcals  Protein:  70-84g protein (1.0-1.2g/kg)  Fluid:  1750-2128mL of fluid (25-38mL/kg)  EDUCATION NEEDS:   No education needs identified at this time   [place], RD, LDN Pager [phone] Weekend/On-Call Pager [phone]

## 2015-09-15 NOTE — NC FL2 (Signed)
[place] LEVEL OF CARE SCREENING TOOL     IDENTIFICATION  Patient Name: Bernard [name] Birthdate: 1919-05-20 Sex: male Admission Date (Current Location): 09/14/2015  [place] and [place] Number:  Engineering geologist and Address:  [facility], [address] [place], [place] [address]      Provider Number: [id number]  Attending Physician Name and Address:  Demetrios Loll, MD  Relative Name and Phone Number:       Current Level of Care: Hospital Recommended Level of Care: [place] ([facility]) Prior Approval Number:    Date Approved/Denied:   PASRR Number:    Discharge Plan: Domiciliary (Rest home) ([facility])    Current Diagnoses: Patient Active Problem List   Diagnosis Date Noted  . Hypernatremia 09/14/2015  . Elevated troponin 09/14/2015  . Hypothyroidism 09/14/2015  . HTN (hypertension) 09/14/2015  . GERD (gastroesophageal reflux disease) 09/14/2015  . UTI (lower urinary tract infection) 09/14/2015  . Dementia 09/14/2015  . Protein-calorie malnutrition, severe 08/25/2015  . Acute-on-chronic kidney injury ([place]) 08/24/2015  . PULMONARY FIBROSIS 08/23/2008    Orientation RESPIRATION BLADDER Height & Weight    Self  Normal Incontinent 5\' 8"  (172.7 cm) 120 lbs.  BEHAVIORAL SYMPTOMS/MOOD NEUROLOGICAL BOWEL NUTRITION STATUS   (None)  (None) Incontinent Diet (Soft)  AMBULATORY STATUS COMMUNICATION OF NEEDS Skin   Extensive Assist Non-Verbally Normal                       Personal Care Assistance Level of Assistance  Bathing, Dressing, Feeding Bathing Assistance: Limited assistance Feeding assistance: Independent Dressing Assistance: Limited assistance     Functional Limitations Info  Sight, Hearing, Speech Sight Info: Adequate Hearing Info: Adequate Speech Info: Adequate    SPECIAL CARE FACTORS FREQUENCY  PT (By licensed PT), OT (By licensed OT)     PT Frequency: 5 OT  Frequency: 5            Contractures      Additional Factors Info  Code Status Code Status Info:  (DNR) Allergies Info:  (No Known Allergies )           Current Medications (09/15/2015):  This is the current hospital active medication list Current Facility-Administered Medications  Medication Dose Route Frequency Provider Last Rate Last Dose  . acetaminophen (TYLENOL) tablet 650 mg  650 mg Oral Q6H PRN Lance Coon, MD       Or  . acetaminophen (TYLENOL) suppository 650 mg  650 mg Rectal Q6H PRN Lance Coon, MD      . antiseptic oral rinse (CPC / CETYLPYRIDINIUM CHLORIDE 0.05%) solution 7 mL  7 mL Mouth Rinse BID Lance Coon, MD   7 mL at 09/14/15 2200  . cefTRIAXone (ROCEPHIN) 1 g in dextrose 5 % 50 mL IVPB  1 g Intravenous Q24H Lance Coon, MD   1 g at 09/14/15 2218  . dextrose 5 % solution   Intravenous Continuous Demetrios Loll, MD 75 mL/hr at 09/14/15 0840    . heparin injection 5,000 Units  5,000 Units Subcutaneous 3 times per day Lance Coon, MD   5,000 Units at 09/15/15 0600  . levothyroxine (SYNTHROID, LEVOTHROID) tablet 125 mcg  125 mcg Oral QAC breakfast Lance Coon, MD   125 mcg at 09/15/15 [phone]  . ondansetron (ZOFRAN) tablet 4 mg  4 mg Oral Q6H PRN Lance Coon, MD       Or  . ondansetron [facility]) injection 4  mg  4 mg Intravenous Q6H PRN Lance Coon, MD      . pantoprazole (PROTONIX) EC tablet 40 mg  40 mg Oral QAC breakfast Lance Coon, MD   40 mg at 09/15/15 [id number]  . potassium chloride 10 mEq in 100 mL IVPB  10 mEq Intravenous Q1 Hr x 6 Lance Coon, MD   10 mEq at 09/15/15 1021  . sodium chloride 0.9 % injection 3 mL  3 mL Intravenous Q12H Lance Coon, MD   3 mL at 09/15/15 1000     Discharge Medications: Please see discharge summary for a list of discharge medications.  Relevant Imaging Results:  Relevant Lab Results:   Additional Information  (SSN: [id number])  [place], LCSW

## 2015-09-15 NOTE — Progress Notes (Signed)
[place] [facility] [place] NAME: Bernard [name]    MR#:  [id number]  DATE OF BIRTH:  02-25-1919  SUBJECTIVE:  CHIEF COMPLAINT:   Chief Complaint  Patient presents with  . Fall   The patient is demented and noncommunicative. He refused to be feed. REVIEW OF SYSTEMS:  Unable to obtain.  DRUG ALLERGIES:  No Known Allergies  VITALS:  Blood pressure 88/58, pulse 88, temperature 97.4 F (36.3 C), temperature source Oral, resp. rate 16, height 5\' 8"  (1.727 m), weight 54.8 kg (120 lb 13 oz), SpO2 100 %.  PHYSICAL EXAMINATION:  GENERAL:  81 y.o.-year-old patient lying in the bed with lethargy. EYES: Pupils equal, round, reactive to light and accommodation. No scleral icterus.  HEENT: Head atraumatic, normocephalic. NECK:  Supple, no jugular venous distention. No thyroid enlargement, no tenderness.  LUNGS: Normal breath sounds bilaterally, no wheezing, rales,rhonchi or crepitation. No use of accessory muscles of respiration.  CARDIOVASCULAR: S1, S2 normal. Systolic murmurs 2/6, no rubs, or gallops.  ABDOMEN: Soft, nontender, nondistended. Bowel sounds present. No organomegaly or mass.  EXTREMITIES: No pedal edema, cyanosis, or clubbing.  NEUROLOGIC: Unable to exam.   PSYCHIATRIC: The patient is demented and noncommunicative. SKIN: No obvious rash, lesion, or ulcer. Very poor turgor.   LABORATORY PANEL:   CBC  Recent Labs Lab 09/14/15 0145  WBC 7.9  HGB 9.7*  HCT 28.9*  PLT 273   ------------------------------------------------------------------------------------------------------------------  Chemistries   Recent Labs Lab 09/14/15 0145  09/15/15 0514  NA 163*  < > 159*  K 3.5  --  2.6*  CL 123*  --  120*  CO2 28  --  31  GLUCOSE 107*  --  103*  BUN 75*  --  70*  CREATININE 3.60*  --  3.17*  CALCIUM 9.1  --  8.7*  MG 2.3  --   --   AST 28  --   --   ALT 17  --   --   ALKPHOS 72  --   --   BILITOT 4.9*  --   --   < >  = values in this interval not displayed. ------------------------------------------------------------------------------------------------------------------  Cardiac Enzymes  Recent Labs Lab 09/14/15 1731  TROPONINI 0.17*   ------------------------------------------------------------------------------------------------------------------  RADIOLOGY:  No results found.  EKG:   Orders placed or performed during the hospital encounter of 09/14/15  . ED EKG  . ED EKG  . EKG 12-Lead  . EKG 12-Lead    ASSESSMENT AND PLAN:   Hypernatremia , due to profound dehydration. Improving but sodium is still high. hydrated with normal saline, since sodium was high at 163, changed to D5 IV. Follow up BMP.  Acute-on-chronic kidney injury ([place]) - due to poor by mouth intake and UTI. Continue D5 IV and follow-up BMP.   Hypokalemia. K2.6. Give potassium supplement and follow-up BMP. Magnesium is normal.  UTI (lower urinary tract infection) - nitrite-positive urine. Continue Rocephin.   Elevated troponin - likely due to demand ischemia and renal failure.   HTN (hypertension) - hypotensive, hold all antihypertensives for now. Continue IVF.   Dementia - baseline is declining per patient's daughter. Fairly communicative at baseline, though he does not typically recognizes most people.   Hypothyroidism - continue home dose thyroid replacement  Discussed with Dr. Candiss Norse. Discussed with the patient's wife and daughter, they don't want NGT or bacterial placement. His wife said the patient will get hospice care after discharge. All the records are reviewed and  case discussed with Care Management/Social Workerr. Management plans discussed with the patient, his wife and daughter and they are in agreement.  CODE STATUS: Full code   TOTAL TIME TAKING CARE OF THIS PATIENT: 45 minutes.  Greater than 50% time was spent on coordination of care and face-to-face counseling.  POSSIBLE D/C IN >3 DAYS,  DEPENDING ON CLINICAL CONDITION.   Demetrios Loll M.D on 09/15/2015 at 1:28 PM  Between 7am to 6pm - Pager - [phone]  After 6pm go to [email or web address] - password [email or web address] [facility]  [facility]  Office  [phone]  CC: Primary care physician; No primary care provider on file.

## 2015-09-15 NOTE — Consult Note (Signed)
Date: 09/15/2015                  Patient Name:  Bernard [name]  MRN: [id number]  DOB: 06-Oct-1918  Age / Sex: 81 y.o., male         PCP: No primary care provider on file.                 Service Requesting Consult:  internal medicine                  Reason for Consult:  acute renal failure and hypernatremia             History of Present Illness: Patient is a 81 y.o. male with medical problems of severe dementia, GERD, chronic kidney disease, hypertension, arthritis, who was admitted to [facility] on 09/14/2015 for evaluation status post fall at the rehabilitation center and generalized weakness.  Please note that all information is obtained from chart and from the family. Patient has severe dementia and is not able to provide any meaningful information. Per family, patient has been refusing to eat for the past several weeks. He eats very little and then feels full. They think that patient has some difficulty swallowing. They do not want for speeding in the form of NG tube. Patient currently has soft restraints as he tries to pull on all lines and catheters. Review of labs show that his baseline creatinine is 1.57 noted on December 7/GFR of 36 Admission creatinine was 3.60, sodium of 163 With IV fluid supplementation, sodium has improved slightly to 159 Other electrolyte abnormalities include potassium of 2.6   Medications: Outpatient medications: Prescriptions prior to admission  Medication Sig Dispense Refill Last Dose  . acetaminophen (TYLENOL) 325 MG tablet Take 2 tablets (650 mg total) by mouth every 6 (six) hours as needed for mild pain (or Fever >/= 101).   PRN at PRN  . feeding supplement, ENSURE ENLIVE, (ENSURE ENLIVE) LIQD Take 237 mLs by mouth 2 (two) times daily between meals. 237 mL 12   . furosemide (LASIX) 40 MG tablet Take 0.5 tablets (20 mg total) by mouth daily. 30 tablet 0 unknown at unknown  . levothyroxine (SYNTHROID, LEVOTHROID) 125 MCG tablet Take 125 mcg by mouth  daily before breakfast.   unknown at unknown  . Multiple Vitamin (MULTIVITAMIN) tablet Take 1 tablet by mouth daily.   unknown at unknown  . ondansetron (ZOFRAN) 4 MG tablet Take 1 tablet (4 mg total) by mouth every 6 (six) hours as needed for nausea. 20 tablet 0 PRN at PRN  . oxyCODONE (OXY IR/ROXICODONE) 5 MG immediate release tablet Take 1 tablet (5 mg total) by mouth every 4 (four) hours as needed for moderate pain. 30 tablet 0 PRN at PRN  . pantoprazole (PROTONIX) 40 MG tablet Take 1 tablet (40 mg total) by mouth daily before breakfast. 30 tablet 0 unknown at unknown  . vitamin B-12 (CYANOCOBALAMIN) 100 MCG tablet Take 100 mcg by mouth daily.   unknown at unknown    Current medications: Current Facility-Administered Medications  Medication Dose Route Frequency Provider Last Rate Last Dose  . acetaminophen (TYLENOL) tablet 650 mg  650 mg Oral Q6H PRN Lance Coon, MD       Or  . acetaminophen (TYLENOL) suppository 650 mg  650 mg Rectal Q6H PRN Lance Coon, MD      . antiseptic oral rinse (CPC / CETYLPYRIDINIUM CHLORIDE 0.05%) solution 7 mL  7 mL Mouth Rinse BID Lance Coon, MD  7 mL at 09/15/15 1000  . cefTRIAXone (ROCEPHIN) 1 g in dextrose 5 % 50 mL IVPB  1 g Intravenous Q24H Lance Coon, MD   1 g at 09/14/15 2218  . dextrose 5 % 1,000 mL with potassium chloride 40 mEq infusion   Intravenous Continuous Demetrios Loll, MD 75 mL/hr at 09/15/15 1237    . heparin injection 5,000 Units  5,000 Units Subcutaneous 3 times per day Lance Coon, MD   5,000 Units at 09/15/15 0600  . levothyroxine (SYNTHROID, LEVOTHROID) tablet 125 mcg  125 mcg Oral QAC breakfast Lance Coon, MD   125 mcg at 09/15/15 [phone]  . ondansetron (ZOFRAN) tablet 4 mg  4 mg Oral Q6H PRN Lance Coon, MD       Or  . ondansetron [facility]) injection 4 mg  4 mg Intravenous Q6H PRN Lance Coon, MD      . pantoprazole (PROTONIX) EC tablet 40 mg  40 mg Oral QAC breakfast Lance Coon, MD   40 mg at 09/15/15 [id number]  . sodium chloride 0.9  % injection 3 mL  3 mL Intravenous Q12H Lance Coon, MD   3 mL at 09/15/15 1000      Allergies: No Known Allergies    Past Medical History: Past Medical History  Diagnosis Date  . Hypothyroidism   . Arthritis   . HTN (hypertension)   . Resting tremor   . GERD (gastroesophageal reflux disease)   . CKD (chronic kidney disease), stage III      Past Surgical History: Past Surgical History  Procedure Laterality Date  . Appendectomy    . Cholecystectomy       Family History: Family History  Problem Relation Age of Onset  . Pneumonia    . Emphysema    . Cancer       Social History: Social History   Social History  . Marital Status: Single    Spouse Name: N/A  . Number of Children: 2  . Years of Education: N/A   Occupational History  . retired    Social History Main Topics  . Smoking status: Former Smoker -- 2.00 packs/day for 25 years    Quit date: 03/02/1970  . Smokeless tobacco: Not on file  . Alcohol Use: No  . Drug Use: Not on file  . Sexual Activity: Not on file   Other Topics Concern  . Not on file   Social History Narrative     Review of Systems: Not available Gen:  HEENT:  CV:  Resp:  GI: GU :  MS:  Derm:   Psych: Heme:  Neuro:  Endocrine  Vital Signs: Blood pressure 88/58, pulse 88, temperature 97.4 F (36.3 C), temperature source Oral, resp. rate 16, height 5\' 8"  (1.727 m), weight 54.8 kg (120 lb 13 oz), SpO2 100 %.   Intake/Output Summary (Last 24 hours) at 09/15/15 1352 Last data filed at 09/15/15 1335  Gross per 24 hour  Intake      0 ml  Output    150 ml  Net   -150 ml    Weight trends: [employer]   09/14/15 0026  Weight: 54.8 kg (120 lb 13 oz)    Physical Exam: General:  frail, elderly, laying in the bed, some agitation, appears cachectic   HEENT  dry oral mucous membranes   Neck:  supple   Lungs:  normal respiratory effort, clear to auscultation anteriorly and laterally   Heart::  irregular rhythm, no  rub   Abdomen:  soft, nontender, nondistended   Extremities:  no peripheral edema   Neurologic:  alert, able to have some conversation with family   Skin:  scattered bruises, no acute rashes   Access:   Foley:        Lab results: Basic Metabolic Panel:  Recent Labs Lab 09/14/15 0145  09/14/15 1731 09/14/15 2337 09/15/15 0514  NA 163*  < > 161* 159* 159*  K 3.5  --   --   --  2.6*  CL 123*  --   --   --  120*  CO2 28  --   --   --  31  GLUCOSE 107*  --   --   --  103*  BUN 75*  --   --   --  70*  CREATININE 3.60*  --   --   --  3.17*  CALCIUM 9.1  --   --   --  8.7*  MG 2.3  --   --   --   --   < > = values in this interval not displayed.  Liver Function Tests:  Recent Labs Lab 09/14/15 0145  AST 28  ALT 17  ALKPHOS 72  BILITOT 4.9*  PROT 6.9  ALBUMIN 3.4*   No results for input(s): LIPASE, AMYLASE in the last 168 hours. No results for input(s): AMMONIA in the last 168 hours.  CBC:  Recent Labs Lab 09/14/15 0145  WBC 7.9  NEUTROABS 5.8  HGB 9.7*  HCT 28.9*  MCV 95.6  PLT 273    Cardiac Enzymes:  Recent Labs Lab 09/14/15 1731  TROPONINI 0.17*    BNP: Invalid input(s): POCBNP  CBG: No results for input(s): GLUCAP in the last 168 hours.  Microbiology: Recent Results (from the past 720 hour(s))  MRSA PCR Screening     Status: None   Collection Time: 08/24/15 11:49 PM  Result Value Ref Range Status   MRSA by PCR NEGATIVE NEGATIVE Final    Comment:        The GeneXpert MRSA Assay (FDA approved for NASAL specimens only), is one component of a comprehensive MRSA colonization surveillance program. It is not intended to diagnose MRSA infection nor to guide or monitor treatment for MRSA infections.   Urine culture     Status: None   Collection Time: 09/14/15  2:55 AM  Result Value Ref Range Status   Specimen Description URINE, RANDOM  Final   Special Requests NONE  Final   Culture MULTIPLE SPECIES PRESENT, SUGGEST RECOLLECTION  Final    Report Status 09/15/2015 FINAL  Final  MRSA PCR Screening     Status: None   Collection Time: 09/14/15  6:42 AM  Result Value Ref Range Status   MRSA by PCR NEGATIVE NEGATIVE Final    Comment:        The GeneXpert MRSA Assay (FDA approved for NASAL specimens only), is one component of a comprehensive MRSA colonization surveillance program. It is not intended to diagnose MRSA infection nor to guide or monitor treatment for MRSA infections.      Coagulation Studies: No results for input(s): LABPROT, INR in the last 72 hours.  Urinalysis:  Recent Labs  09/14/15 0255  COLORURINE AMBER*  LABSPEC 1.019  PHURINE 5.0  GLUCOSEU NEGATIVE  HGBUR 1+*  BILIRUBINUR 1+*  KETONESUR TRACE*  PROTEINUR 30*  NITRITE POSITIVE*  LEUKOCYTESUR NEGATIVE        Imaging:  No results found.   Assessment & Plan: Pt is a 81  y.o. yo male with a PMHX of severe dementia, GERD, chronic kidney disease, hypertension, arthritis, severe BPH, was admitted on 09/14/2015 with fall at the rehabilitation center, failure to thrive, acute renal failure, urinary tract infection and severe hypernatremia  1. Acute renal failure on chronic kidney disease stage III. Baseline creatinine 1.6/GFR 36 - Acute renal failure is likely secondary to volume depletion leading to ATN - Patient is refusing to eat or drink - Family does not desire aggressive measures such as NG tube  2. Severe hypernatremia - D5 W gentle replacement as you're doing  3. Hypo-kalemia - IV potassium replacement  4. Urinary tract infection - Treatment as per primary team - Currently on IV Rocephin

## 2015-09-16 LAB — BASIC METABOLIC PANEL
Anion gap: 4 — ABNORMAL LOW (ref 5–15)
BUN: 58 mg/dL — ABNORMAL HIGH (ref 6–20)
CALCIUM: 8.4 mg/dL — AB (ref 8.9–10.3)
CO2: 29 mmol/L (ref 22–32)
CREATININE: 2.83 mg/dL — AB (ref 0.61–1.24)
Chloride: 120 mmol/L — ABNORMAL HIGH (ref 101–111)
GFR, EST AFRICAN AMERICAN: 20 mL/min — AB (ref >60)
GFR, EST NON AFRICAN AMERICAN: 18 mL/min — AB (ref >60)
Glucose, Bld: 111 mg/dL — ABNORMAL HIGH (ref 65–99)
Potassium: 4 mmol/L (ref 3.5–5.1)
Sodium: 153 mmol/L — ABNORMAL HIGH (ref 135–145)

## 2015-09-16 NOTE — Care Management Important Message (Signed)
Important Message  Patient Details  Name: Bernard [name] MRN: [id number] Date of Birth: 03/29/1919   Medicare Important Message Given:  Yes    Marshell Garfinkel, RN 09/16/2015, 8:00 AM

## 2015-09-16 NOTE — Progress Notes (Signed)
Report called to [facility] 1C RN, pt room packed. Family at bedside and aware of transfer. Orderly notified.

## 2015-09-16 NOTE — Progress Notes (Signed)
Clinical Education officer, museum (CSW) met with patient's daughter Malachy Mood HPOA at bedside. Daughter presented HPOA paper work and [place] made a copy and placed on chart. Per daughter patient is paying privately at [facility] and inquired about hospice services. CSW gave daughter a brief overview of hospice services and reported that [facility] liaison will provide more detailed information about services. Daughter asked about SNF level of care. Daughter reported that patient's insurance would not pay for rehab and he used all his days at [facility]. CSW explained long term care at a SNF. Per daughter patient cannot pay privately for SNF and she would like patient to return to [facility] with hospice care. Patient transferred to 1C. CSW gave report to 1C CSW.   Blima Rich, [place] [phone]

## 2015-09-16 NOTE — Progress Notes (Signed)
[facility] [place] NAME: Bernard [name]    MR#:  [id number]  DATE OF BIRTH:  1919-07-21  SUBJECTIVE:  CHIEF COMPLAINT:   Chief Complaint  Patient presents with  . Fall   The patient is demented and noncommunicative. He still  refused to be feed. REVIEW OF SYSTEMS:  Unable to obtain.  DRUG ALLERGIES:  No Known Allergies  VITALS:  Blood pressure 145/51, pulse 73, temperature 97.4 F (36.3 C), temperature source Oral, resp. rate 15, height 5\' 8"  (1.727 m), weight 57.199 kg (126 lb 1.6 oz), SpO2 100 %.  PHYSICAL EXAMINATION:  GENERAL:  81 y.o.-year-old patient sitting in chair. NAD. EYES: Pupils equal, round, reactive to light and accommodation. No scleral icterus.  HEENT: Head atraumatic, normocephalic. NECK:  Supple, no jugular venous distention. No thyroid enlargement, no tenderness.  LUNGS: Normal breath sounds bilaterally, no wheezing, rales,rhonchi or crepitation. No use of accessory muscles of respiration.  CARDIOVASCULAR: S1, S2 normal. Systolic murmurs 2/6, no rubs, or gallops.  ABDOMEN: Soft, nontender, nondistended. Bowel sounds present. No organomegaly or mass.  EXTREMITIES: No pedal edema, cyanosis, or clubbing.  NEUROLOGIC: Unable to exam.   PSYCHIATRIC: The patient is demented and noncommunicative. SKIN: No obvious rash, lesion, or ulcer. Very poor turgor.   LABORATORY PANEL:   CBC  Recent Labs Lab 09/14/15 0145  WBC 7.9  HGB 9.7*  HCT 28.9*  PLT 273   ------------------------------------------------------------------------------------------------------------------  Chemistries   Recent Labs Lab 09/14/15 0145  09/16/15 0650  NA 163*  < > 153*  K 3.5  < > 4.0  CL 123*  < > 120*  CO2 28  < > 29  GLUCOSE 107*  < > 111*  BUN 75*  < > 58*  CREATININE 3.60*  < > 2.83*  CALCIUM 9.1  < > 8.4*  MG 2.3  --   --   AST 28  --   --   ALT 17  --   --   ALKPHOS 72  --   --   BILITOT 4.9*  --   --   < >  = values in this interval not displayed. ------------------------------------------------------------------------------------------------------------------  Cardiac Enzymes  Recent Labs Lab 09/14/15 1731  TROPONINI 0.17*   ------------------------------------------------------------------------------------------------------------------  RADIOLOGY:  No results found.  EKG:   Orders placed or performed during the hospital encounter of 09/14/15  . ED EKG  . ED EKG  . EKG 12-Lead  . EKG 12-Lead    ASSESSMENT AND PLAN:   Hypernatremia , due to profound dehydration. Improving but sodium is still high. hydrated with normal saline, since sodium is high, changed to D5 IV. Follow up BMP.  Acute-on-chronic kidney injury ([place]) - due to poor by mouth intake and UTI. Continue D5 IV and follow-up BMP.   Hypokalemia. K up to 4.0 from 2.6. Given potassium supplement and follow-up BMP. Magnesium is normal.  UTI (lower urinary tract infection) - nitrite-positive urine. Continue Rocephin.   Elevated troponin - likely due to demand ischemia and renal failure.   HTN (hypertension), was hypotensive, hold all antihypertensives for now. BP is better.   Dementia - baseline is declining per patient's daughter. Fairly communicative at baseline, though he does not typically recognizes most people.   Hypothyroidism - continue home dose thyroid replacement  Discussed with Dr. Candiss Norse. Discussed with the patient's wife and daughter, they don't want NGT or bacterial placement. His wife said the patient will get hospice care after discharge. All the  records are reviewed and case discussed with Care Management/Social Workerr. Management plans discussed with the patient, his wife and daughter and they are in agreement.  CODE STATUS: Full code   TOTAL TIME TAKING CARE OF THIS PATIENT: 38 minutes.  Greater than 50% time was spent on coordination of care and face-to-face counseling.  POSSIBLE D/C  IN 2-3 DAYS, DEPENDING ON CLINICAL CONDITION.   Demetrios Loll M.D on 09/16/2015 at 1:19 PM  Between 7am to 6pm - Pager - [phone]  After 6pm go to [email or web address] - password [email or web address] [facility]  [facility]  Office  [phone]  CC: Primary care physician; No primary care provider on file.

## 2015-09-17 LAB — CBC
HCT: 22.1 % — ABNORMAL LOW (ref 40.0–52.0)
HEMOGLOBIN: 7.4 g/dL — AB (ref 13.0–18.0)
MCH: 31.2 pg (ref 26.0–34.0)
MCHC: 33.6 g/dL (ref 32.0–36.0)
MCV: 92.9 fL (ref 80.0–100.0)
Platelets: 153 10*3/uL (ref 150–440)
RBC: 2.38 MIL/uL — AB (ref 4.40–5.90)
RDW: 18.1 % — ABNORMAL HIGH (ref 11.5–14.5)
WBC: 3.3 10*3/uL — ABNORMAL LOW (ref 3.8–10.6)

## 2015-09-17 LAB — BASIC METABOLIC PANEL
Anion gap: 4 — ABNORMAL LOW (ref 5–15)
BUN: 48 mg/dL — ABNORMAL HIGH (ref 6–20)
CHLORIDE: 118 mmol/L — AB (ref 101–111)
CO2: 27 mmol/L (ref 22–32)
Calcium: 8.4 mg/dL — ABNORMAL LOW (ref 8.9–10.3)
Creatinine, Ser: 2.51 mg/dL — ABNORMAL HIGH (ref 0.61–1.24)
GFR calc non Af Amer: 20 mL/min — ABNORMAL LOW (ref >60)
GFR, EST AFRICAN AMERICAN: 24 mL/min — AB (ref >60)
Glucose, Bld: 104 mg/dL — ABNORMAL HIGH (ref 65–99)
POTASSIUM: 3.7 mmol/L (ref 3.5–5.1)
SODIUM: 149 mmol/L — AB (ref 135–145)

## 2015-09-17 LAB — OCCULT BLOOD X 1 CARD TO LAB, STOOL: FECAL OCCULT BLD: NEGATIVE

## 2015-09-17 MED ORDER — SODIUM CHLORIDE 0.9 % IV BOLUS (SEPSIS)
250.0000 mL | Freq: Once | INTRAVENOUS | Status: AC
  Administered 2015-09-17: 06:00:00 250 mL via INTRAVENOUS

## 2015-09-17 NOTE — Progress Notes (Signed)
[facility]  ROUNDING NOTE   Subjective:  Patient appears to be improving today. Serum sodium down to 149 with a creatinine of 2.51. Difficult to comprehend speech.  Objective:  Vital signs in last 24 hours:  Temp:  [97.4 F (36.3 C)] 97.4 F (36.3 C) (12/28 0554) Pulse Rate:  [72-81] 81 (12/28 0837) Resp:  [15-18] 17 (12/28 0554) BP: (84-145)/(46-73) 105/73 mmHg (12/28 0837) SpO2:  [99 %-100 %] 100 % (12/28 0837)  Weight change:  Filed Weights   09/14/15 0026 09/16/15 0500  Weight: 54.8 kg (120 lb 13 oz) 57.199 kg (126 lb 1.6 oz)    Intake/Output: I/O last 3 completed shifts: In: 0  Out: 275 [Urine:275]   Intake/Output this shift:     Physical Exam: General: Cachetic NAD  Head: Normocephalic, atraumatic. Dry oral mucosa  Eyes: Anicteric  Neck: Supple, trachea midline  Lungs:  Clear to auscultation  Heart: S1S2 no rubs  Abdomen:  Soft, nontender, BS present   Extremities: no peripheral edema.  Neurologic: Awake, will follow simple commands  Skin: No lesions       Basic Metabolic Panel:  Recent Labs Lab 09/14/15 0145  09/14/15 1731 09/14/15 2337 09/15/15 0514 09/16/15 0650 09/17/15 0547  NA 163*  < > 161* 159* 159* 153* 149*  K 3.5  --   --   --  2.6* 4.0 3.7  CL 123*  --   --   --  120* 120* 118*  CO2 28  --   --   --  31 29 27   GLUCOSE 107*  --   --   --  103* 111* 104*  BUN 75*  --   --   --  70* 58* 48*  CREATININE 3.60*  --   --   --  3.17* 2.83* 2.51*  CALCIUM 9.1  --   --   --  8.7* 8.4* 8.4*  MG 2.3  --   --   --   --   --   --   < > = values in this interval not displayed.  Liver Function Tests:  Recent Labs Lab 09/14/15 0145  AST 28  ALT 17  ALKPHOS 72  BILITOT 4.9*  PROT 6.9  ALBUMIN 3.4*   No results for input(s): LIPASE, AMYLASE in the last 168 hours. No results for input(s): AMMONIA in the last 168 hours.  CBC:  Recent Labs Lab 09/14/15 0145 09/17/15 0547  WBC 7.9 3.3*  NEUTROABS 5.8  --   HGB 9.7*  7.4*  HCT 28.9* 22.1*  MCV 95.6 92.9  PLT 273 153    Cardiac Enzymes:  Recent Labs Lab 09/14/15 0145 09/14/15 0601 09/14/15 1105 09/14/15 1731  TROPONINI 0.16* 0.14* 0.15* 0.17*    BNP: Invalid input(s): POCBNP  CBG: No results for input(s): GLUCAP in the last 168 hours.  Microbiology: Results for orders placed or performed during the hospital encounter of 09/14/15  Urine culture     Status: None   Collection Time: 09/14/15  2:55 AM  Result Value Ref Range Status   Specimen Description URINE, RANDOM  Final   Special Requests NONE  Final   Culture MULTIPLE SPECIES PRESENT, SUGGEST RECOLLECTION  Final   Report Status 09/15/2015 FINAL  Final  MRSA PCR Screening     Status: None   Collection Time: 09/14/15  6:42 AM  Result Value Ref Range Status   MRSA by PCR NEGATIVE NEGATIVE Final    Comment:  The GeneXpert MRSA Assay (FDA approved for NASAL specimens only), is one component of a comprehensive MRSA colonization surveillance program. It is not intended to diagnose MRSA infection nor to guide or monitor treatment for MRSA infections.     Coagulation Studies: No results for input(s): LABPROT, INR in the last 72 hours.  Urinalysis: No results for input(s): COLORURINE, LABSPEC, PHURINE, GLUCOSEU, HGBUR, BILIRUBINUR, KETONESUR, PROTEINUR, UROBILINOGEN, NITRITE, LEUKOCYTESUR in the last 72 hours.  Invalid input(s): APPERANCEUR    Imaging: No results found.   Medications:   . dextrose 5 % with kcl 75 mL/hr at 09/17/15 1056   . cefTRIAXone (ROCEPHIN)  IV  1 g Intravenous Q24H  . feeding supplement (ENSURE ENLIVE)  237 mL Oral TID WC  . heparin  5,000 Units Subcutaneous 3 times per day  . levothyroxine  125 mcg Oral QAC breakfast  . pantoprazole  40 mg Oral QAC breakfast  . sodium chloride  3 mL Intravenous Q12H   acetaminophen **OR** acetaminophen, ondansetron **OR** ondansetron (ZOFRAN) IV  Assessment/ Plan:  81 y.o. male with a PMHX of severe  dementia, GERD, chronic kidney disease, hypertension, arthritis, severe BPH, was admitted on 09/14/2015 with fall at the rehabilitation center, failure to thrive, acute renal failure, urinary tract infection and severe hypernatremia  1. Acute renal failure on chronic kidney disease stage III. Baseline creatinine 1.6/GFR 36 - Acute renal failure is likely secondary to volume depletion leading to ATN - Patient's by mouth intake remains marginal at present. Creatinine does seem to be improving on IV fluid hydration therefore we will continue this. Continue to monitor renal function daily and avoid nephrotoxins as possible.  2. Severe hypernatremia - Sodium down to 149. Continue D5W at 75 cc per hour and follow up sodium tomorrow a.m.  3. Hypo-kalemia -Potassium was quite low several days ago at 2.6 but now has gone up to 3.7. Continue to monitor.    LOS: 3 Madelena Maturin 12/28/201611:23 AM

## 2015-09-17 NOTE — Plan of Care (Signed)
Problem: Safety: Goal: Ability to remain free from injury will improve Outcome: Progressing Pt remains free from injury.  Safety sitter at the bedside.  Fall precautions in place.  Pt is one assist to [facility]. Safety mitts used to prevent pulling of IV and tubing.  Can be aggressive at times.  Problem: Health Behavior/Discharge Planning: Goal: Ability to manage health-related needs will improve Outcome: Not Progressing Pt has severe dementia, is a poor historian and is not a good candidate for receiving health related information.  Problem: Physical Regulation: Goal: Ability to maintain clinical measurements within normal limits will improve Outcome: Progressing Pt has been hypotensive throughout his time here.   He is receiving IV abx for UTI.

## 2015-09-17 NOTE — Plan of Care (Signed)
Problem: Education: Goal: Knowledge of [facility] Education information/materials will improve Outcome: Not Progressing Pt alert to self only. Calm and cooperative.  Problem: Safety: Goal: Ability to remain free from injury will improve Outcome: Progressing Pt remains free of Injury during the shift. Sitter at the bedside.  Problem: Physical Regulation: Goal: Ability to maintain clinical measurements within normal limits will improve Outcome: Progressing VSS. 1st occult blood stool negative. No bleeding noted. No signs of pain nor discomfort.  Problem: Nutrition: Goal: Adequate nutrition will be maintained Outcome: Progressing Poor appetite. Pt encouraged to eat and drink. Ensure given.  Problem: Urinary Elimination: Goal: Signs and symptoms of infection will decrease Outcome: Progressing Pt voids without difficulty. Tea colored, clear urine. PO fluids encouraged. IVF infusing.

## 2015-09-17 NOTE — Progress Notes (Addendum)
Bernard [name] at [place] NAME: Bernard [name]    MR#:  [id number]  DATE OF BIRTH:  1919/05/25  SUBJECTIVE:  CHIEF COMPLAINT:   Chief Complaint  Patient presents with  . Fall   The patient is demented and noncommunicative. Oral intake remains low, sodium level has been improving to 149 today, kidney function is better then over the past few days, no review of systems REVIEW OF SYSTEMS:  Unable to obtain.  DRUG ALLERGIES:  No Known Allergies  VITALS:  Blood pressure 105/73, pulse 81, temperature 97.4 F (36.3 C), temperature source Oral, resp. rate 17, height 5\' 8"  (1.727 m), weight 57.199 kg (126 lb 1.6 oz), SpO2 100 %.  PHYSICAL EXAMINATION:  GENERAL:  81 y.o.-year-old patient sitting in chair. NAD. EYES: Pupils equal, round, reactive to light and accommodation. No scleral icterus.  HEENT: Head atraumatic, normocephalic. NECK:  Supple, no jugular venous distention. No thyroid enlargement, no tenderness.  LUNGS: Normal breath sounds bilaterally, no wheezing, rales,rhonchi or crepitation. No use of accessory muscles of respiration.  CARDIOVASCULAR: S1, S2 normal. Systolic murmurs 2/6, no rubs, or gallops.  ABDOMEN: Soft, nontender, nondistended. Bowel sounds present. No organomegaly or mass.  EXTREMITIES: No pedal edema, cyanosis, or clubbing.  NEUROLOGIC: Unable to exam.   PSYCHIATRIC: The patient is demented and noncommunicative. SKIN: No obvious rash, lesion, or ulcer. Very poor turgor.   LABORATORY PANEL:   CBC  Recent Labs Lab 09/17/15 0547  WBC 3.3*  HGB 7.4*  HCT 22.1*  PLT 153   ------------------------------------------------------------------------------------------------------------------  Chemistries   Recent Labs Lab 09/14/15 0145  09/17/15 0547  NA 163*  < > 149*  K 3.5  < > 3.7  CL 123*  < > 118*  CO2 28  < > 27  GLUCOSE 107*  < > 104*  BUN 75*  < > 48*  CREATININE 3.60*  < > 2.51*  CALCIUM 9.1  <  > 8.4*  MG 2.3  --   --   AST 28  --   --   ALT 17  --   --   ALKPHOS 72  --   --   BILITOT 4.9*  --   --   < > = values in this interval not displayed. ------------------------------------------------------------------------------------------------------------------  Cardiac Enzymes  Recent Labs Lab 09/14/15 1731  TROPONINI 0.17*   ------------------------------------------------------------------------------------------------------------------  RADIOLOGY:  No results found.  EKG:   Orders placed or performed during the hospital encounter of 09/14/15  . ED EKG  . ED EKG  . EKG 12-Lead  . EKG 12-Lead    ASSESSMENT AND PLAN:   Hypernatremia , due to profound dehydration. Improving but sodium is still high. hydrated with normal saline, since sodium is high, changed to D5 IV. Follow up BMP in the morning. Patient should be treated as palliative care patient due to severe dementia and impaired oral intake.  Acute-on-chronic kidney injury ([place]) - due to poor by mouth intake and UTI. Continue D5 IV and follow-up BMP. Improving  Hypokalemia. K up to 4.0 from 2.6. Given potassium supplement and follow-up BMP. Magnesium is normal.  UTI (lower urinary tract infection) - nitrite-positive urine. Urine cultures revealed multiple species suggested recollection, repeat urinary cultures Continue Rocephin for now.   Elevated troponin - likely due to demand ischemia and renal failure.   HTN (hypertension), was hypotensive, hold all antihypertensives for now. BP is better.   Dementia - the patient is declining per daughter. Fairly communicative at  baseline, though he does not typically recognizes most people.   Hypothyroidism - continue home dose thyroid replacement  Discussed with  hospice liaison . Patient will be discharged home with hospice at home in the next 1-2 days   All the records are reviewed and case discussed with Care Management/Social Workerr. Management plans  discussed with the patient, his wife and daughter and they are in agreement.  CODE STATUS: DO NOT RESUSCITATE per his prior request   TOTAL TIME TAKING CARE OF THIS PATIENT: 40 minutes.  .  POSSIBLE D/C IN 2-3 DAYS, DEPENDING ON CLINICAL CONDITION.   Theodoro Grist M.D on 09/17/2015 at 3:30 PM  Between 7am to 6pm - Pager - [phone]  After 6pm go to [email or web address] - password [email or web address] [facility]  [facility]  Office  [phone]  CC: Primary care physician; No primary care provider on file.

## 2015-09-17 NOTE — Progress Notes (Signed)
New referral for Hospice services at [facility] received prior to patient being hospitalized on 12/25. Bernard [name] is a 81 year old man with a known history of dementia, HTN and hypothyroidism admitted to [facility] from [facility] for treatment of a UTI. Per chart note review patient has had poor oral intake for the last several weeks. He was recently hospitalized from 12/4-12/7 for dehydration and acute kidney injury and was discharged to [facility] for rehab. Per conversation with his family he participated in the rehab "at first". During this hospitalization he has been treated with IV antibiotics and IV fluids. He continues only to take a few bites and sips. Per staff RN Malka patient did take his oral medications whole in applesauce this morning. He has not required any PRN medications for agitation, but does have a 1:1 sitter at bedside due to impulsive behavior. Writer met with the patient's daughter Bernard [name] and granddaughter Bernard [name] to initiate education regarding hospice services, philosophy and team approach to care with good understanding voiced by both. Long discussion was had regarding patient's prognosis based on his not eating and progressive weight loss. Bernard [name] voiced her concern regarding patient returning to the [place] d/t his impulsive behavior and recent falls. Bernard [name] also suggested that she may be able to care for her grandfather at her home. Home care services explained and well as hospice home services. Bernard [name] and Bernard [name] plan to talk with Bernard [name] and discuss the options for Bernard [name]. No firm plan has been set. Contact information left with Bernard [name]. Writer to follow up with her in the morning. Writer did relay this conversation to [place], and also advised her that Bernard [name] lives in [place]. There are currently no [facility] hosp[facility] home beds available at this time.  Flo Shanks RN, BSN, [place] and [facility] of [place],  [facility] [phone] c

## 2015-09-18 LAB — CBC
HCT: 22.4 % — ABNORMAL LOW (ref 40.0–52.0)
Hemoglobin: 7.6 g/dL — ABNORMAL LOW (ref 13.0–18.0)
MCH: 31.4 pg (ref 26.0–34.0)
MCHC: 34.1 g/dL (ref 32.0–36.0)
MCV: 92.2 fL (ref 80.0–100.0)
PLATELETS: 166 10*3/uL (ref 150–440)
RBC: 2.43 MIL/uL — AB (ref 4.40–5.90)
RDW: 17.7 % — ABNORMAL HIGH (ref 11.5–14.5)
WBC: 3.4 10*3/uL — AB (ref 3.8–10.6)

## 2015-09-18 LAB — CREATININE, SERUM
Creatinine, Ser: 2.21 mg/dL — ABNORMAL HIGH (ref 0.61–1.24)
GFR, EST AFRICAN AMERICAN: 28 mL/min — AB (ref >60)
GFR, EST NON AFRICAN AMERICAN: 24 mL/min — AB (ref >60)

## 2015-09-18 LAB — OCCULT BLOOD X 1 CARD TO LAB, STOOL: Fecal Occult Bld: NEGATIVE

## 2015-09-18 LAB — SODIUM: SODIUM: 143 mmol/L (ref 135–145)

## 2015-09-18 MED ORDER — HALOPERIDOL LACTATE 5 MG/ML IJ SOLN
1.0000 mg | Freq: Four times a day (QID) | INTRAMUSCULAR | Status: DC | PRN
  Administered 2015-09-18: 1 mg via INTRAVENOUS
  Filled 2015-09-18: qty 1

## 2015-09-18 NOTE — Progress Notes (Signed)
[place] at [place] NAME: Bernard [name]    MR#:  [id number]  DATE OF BIRTH:  08/30/1919  SUBJECTIVE:  CHIEF COMPLAINT:   Chief Complaint  Patient presents with  . Fall   The patient is alert and somewhat communicative, however, agitated and restless and trying to climb out of bed. Very poor oral intake. Sodium level has improved on IV fluids, as well as kidney function REVIEW OF SYSTEMS:  Unable to obtain.  DRUG ALLERGIES:  No Known Allergies  VITALS:  Blood pressure 100/59, pulse 73, temperature 97.7 F (36.5 C), temperature source Oral, resp. rate 16, height 5\' 8"  (1.727 m), weight 57.199 kg (126 lb 1.6 oz), SpO2 100 %.  PHYSICAL EXAMINATION:  GENERAL:  81 y.o.-year-old patient sitting in chair. NAD. EYES: Pupils equal, round, reactive to light and accommodation. No scleral icterus.  HEENT: Head atraumatic, normocephalic. NECK:  Supple, no jugular venous distention. No thyroid enlargement, no tenderness.  LUNGS: Normal breath sounds bilaterally, no wheezing, rales,rhonchi or crepitation. No use of accessory muscles of respiration.  CARDIOVASCULAR: S1, S2 normal. Systolic murmurs 2/6, no rubs, or gallops.  ABDOMEN: Soft, nontender, nondistended. Bowel sounds present. No organomegaly or mass.  EXTREMITIES: No pedal edema, cyanosis, or clubbing.  NEUROLOGIC: Unable to exam.   PSYCHIATRIC: The patient is demented and noncommunicative. SKIN: No obvious rash, lesion, or ulcer. Very poor turgor.   LABORATORY PANEL:   CBC  Recent Labs Lab 09/18/15 0634  WBC 3.4*  HGB 7.6*  HCT 22.4*  PLT 166   ------------------------------------------------------------------------------------------------------------------  Chemistries   Recent Labs Lab 09/14/15 0145  09/17/15 0547 09/18/15 0634  NA 163*  < > 149* 143  K 3.5  < > 3.7  --   CL 123*  < > 118*  --   CO2 28  < > 27  --   GLUCOSE 107*  < > 104*  --   BUN 75*  < >  48*  --   CREATININE 3.60*  < > 2.51* 2.21*  CALCIUM 9.1  < > 8.4*  --   MG 2.3  --   --   --   AST 28  --   --   --   ALT 17  --   --   --   ALKPHOS 72  --   --   --   BILITOT 4.9*  --   --   --   < > = values in this interval not displayed. ------------------------------------------------------------------------------------------------------------------  Cardiac Enzymes  Recent Labs Lab 09/14/15 1731  TROPONINI 0.17*   ------------------------------------------------------------------------------------------------------------------  RADIOLOGY:  No results found.  EKG:   Orders placed or performed during the hospital encounter of 09/14/15  . ED EKG  . ED EKG  . EKG 12-Lead  . EKG 12-Lead  . EKG 12-Lead  . EKG 12-Lead    ASSESSMENT AND PLAN:   Hypernatremia , due to profound dehydration. Improved but sodium will likely worsen when patient is off IV fluids since oral intake remains low hydrated with normal saline, since sodium is high, changed to D5 IV. Follow up BMP in the morning. Patient should be treated as palliative care patient due to severe dementia and impaired oral intake. Hospice care is to follow and make decisions about the discharge  Acute-on-chronic kidney injury [facility]) - due to poor by mouth intake and UTI. Continue D5 IV and follow-up BMP. Improving  Hypokalemia. K up to 4.0 from 2.6. Given potassium supplement  and follow-up BMP. Magnesium is normal.  UTI (lower urinary tract infection) - nitrite-positive urine. Urine cultures revealed multiple species suggested recollection, repeated urinary cultures, no growth over the past 24 hours We'll discontinue Rocephin. If no growth tomorrow.   Elevated troponin - likely due to demand ischemia and renal failure.   HTN (hypertension), was hypotensive, hold all antihypertensives for now. BP remains low.   Dementia - the patient is declining per daughter. Fairly communicative at baseline, though he does not  typically recognizes most people. Agitated today. Initiating Haldol as needed   Hypothyroidism - continue home dose thyroid replacement  Discussed with  hospice liaison . Patient will be discharged to likely home with hospice at home in the next 1-2 days   All the records are reviewed and case discussed with Care Management/Social Workerr. Management plans discussed with the patient, his wife and daughter and they are in agreement.  CODE STATUS: DO NOT RESUSCITATE per his prior request   TOTAL TIME TAKING CARE OF THIS PATIENT: 30 minutes.  .  POSSIBLE D/C IN 2-3 DAYS, DEPENDING ON CLINICAL CONDITION.   Theodoro Grist M.D on 09/18/2015 at 3:57 PM  Between 7am to 6pm - Pager - [phone]  After 6pm go to [email or web address] - password [email or web address] [facility]  [place] [facility]  Office  [phone]  CC: Primary care physician; No primary care provider on file.

## 2015-09-18 NOTE — Clinical Social Work Note (Signed)
CSW spoke with family regarding discharge plan. Pt's family would like residential hospice. CSW initiated hospice search for [place], [place], [place], and [place]. CSW will continue to follow.   Darden Dates, MSW, LCSW Clinical Social Worker [phone]

## 2015-09-18 NOTE — Plan of Care (Signed)
Problem: Education: Goal: Knowledge of [place] [facility] Education information/materials will improve Outcome: Not Progressing Pt alert to self only. Unable to educate pt.  Problem: Safety: Goal: Ability to remain free from injury will improve Outcome: Progressing Pt remains free of injury during the shift. Sitter at the bedside.  Problem: Physical Regulation: Goal: Ability to maintain clinical measurements within normal limits will improve Outcome: Progressing VSS. Pt was agitated in am, haldol given with improvement. No signs of pain nor discomfort. Sodium and potassium within normal range. Creatinine improved.  Problem: Fluid Volume: Goal: Ability to maintain a balanced intake and output will improve Outcome: Progressing VSS. IVF continues. Poor PO intake. Pt encouraged to eat and drink.   Problem: Urinary Elimination: Goal: Signs and symptoms of infection will decrease Outcome: Progressing Pt voids without difficulty.

## 2015-09-18 NOTE — Progress Notes (Signed)
Follow up visit made on new referral [facility] at [facility] after discharge. Patient restless attempting to get out of bed, sitter at bedside. Patient wanted to get up to use the [facility]. Sitter assisted patient to [facility], he was cooperative and able to stand and pivot with minimal assistance. He was able to get himself back to bed. He is still not eating, refused breakfast this morning. Writer has spoken with both attending Dr. Ether Griffins and CSW Darden Dates. Discharge plan is unclear at this time. Sarah to contact [employer] as well as family regarding options. Plan after writers conversation with family yesterday was for them to discuss the possibility of taking Bernard [name] home vs [facility] vs a hospice home. No beds availability at the [facility]. Of note patient's daughter lives in [place]. Will await final family decision. Flo Shanks RN, BSN, [facility] and [facility] [place], [facility] [phone] c

## 2015-09-18 NOTE — Progress Notes (Signed)
ANTIBIOTIC CONSULT NOTE - Damar for ceftriaxone Indication: UTI  No Known Allergies  Patient Measurements: Height: 5\' 8"  (172.7 cm) Weight: 126 lb 1.6 oz (57.199 kg) IBW/kg (Calculated) : 68.4 Adjusted Body Weight:   Vital Signs:   Intake/Output from previous day: 12/28 0701 - 12/29 0700 In: -  Out: 405 [Urine:405] Intake/Output from this shift:    Labs:  Recent Labs  09/16/15 0650 09/17/15 0547 09/18/15 0634  WBC  --  3.3* 3.4*  HGB  --  7.4* 7.6*  PLT  --  153 166  CREATININE 2.83* 2.51* 2.21*   Estimated Creatinine Clearance: 16.5 mL/min (by C-G formula based on Cr of 2.21). No results for input(s): VANCOTROUGH, VANCOPEAK, VANCORANDOM, GENTTROUGH, GENTPEAK, GENTRANDOM, TOBRATROUGH, TOBRAPEAK, TOBRARND, AMIKACINPEAK, AMIKACINTROU, AMIKACIN in the last 72 hours.   Microbiology: Recent Results (from the past 720 hour(s))  MRSA PCR Screening     Status: None   Collection Time: 08/24/15 11:49 PM  Result Value Ref Range Status   MRSA by PCR NEGATIVE NEGATIVE Final    Comment:        The GeneXpert MRSA Assay (FDA approved for NASAL specimens only), is one component of a comprehensive MRSA colonization surveillance program. It is not intended to diagnose MRSA infection nor to guide or monitor treatment for MRSA infections.   Urine culture     Status: None   Collection Time: 09/14/15  2:55 AM  Result Value Ref Range Status   Specimen Description URINE, RANDOM  Final   Special Requests NONE  Final   Culture MULTIPLE SPECIES PRESENT, SUGGEST RECOLLECTION  Final   Report Status 09/15/2015 FINAL  Final  MRSA PCR Screening     Status: None   Collection Time: 09/14/15  6:42 AM  Result Value Ref Range Status   MRSA by PCR NEGATIVE NEGATIVE Final    Comment:        The GeneXpert MRSA Assay (FDA approved for NASAL specimens only), is one component of a comprehensive MRSA colonization surveillance program. It is not intended to diagnose  MRSA infection nor to guide or monitor treatment for MRSA infections.     Medical History: Past Medical History  Diagnosis Date  . Hypothyroidism   . Arthritis   . HTN (hypertension)   . Resting tremor   . GERD (gastroesophageal reflux disease)   . CKD (chronic kidney disease), stage III     Medications:  Infusions:  . dextrose 5 % with kcl 75 mL/hr at 09/18/15 0117   Assessment: 89 yom cc fall/dehydration. Pt feels weak. History of dementia, agitated in ED. Pharmacy consulted to dose ceftriaxone for UTI.  Urine cultures:  MBO Goal of Therapy:  Resolution of possible infection    Plan:  Ceftriaxone 1 gm IV Q24H. Will recommend discontinuation of therapy to MD during rounds.    Shanen Norris D, Pharm.D., BCPS Clinical Pharmacist 09/18/2015,8:24 AM

## 2015-09-18 NOTE — Plan of Care (Signed)
Problem: Safety: Goal: Ability to remain free from injury will improve Outcome: Progressing Pt continues with a safety sitter due to pt pulling at IVs and impulsiveness to jump out of bed.  Problem: Health Behavior/Discharge Planning: Goal: Ability to manage health-related needs will improve Outcome: Progressing Pt continues on IV abx and is afebrile.  VSS with exception of hypotension.  Problem: Physical Regulation: Goal: Ability to maintain clinical measurements within normal limits will improve Outcome: Progressing 2nd fecal occult specimen negative.

## 2015-09-18 NOTE — Care Management Important Message (Signed)
Important Message  Patient Details  Name: Bernard [name] MRN: [id number] Date of Birth: 04/03/1919   Medicare Important Message Given:  Yes    Juliann Pulse A Florena Kozma 09/18/2015, 10:26 AM

## 2015-09-18 NOTE — Progress Notes (Signed)
[facility]  ROUNDING NOTE   Subjective:  Patient lethargic but arousable this a.m. Hemoglobin currently 7.6. Sodium down to 143 with hydration. Creatinine also down to 2.21. Sitter at the bedside.  Objective:  Vital signs in last 24 hours:  Temp:  [97.5 F (36.4 C)-97.8 F (36.6 C)] 97.7 F (36.5 C) (12/29 0909) Pulse Rate:  [71-91] 71 (12/29 0905) Resp:  [17-18] 17 (12/29 0905) BP: (88-96)/(55-64) 92/63 mmHg (12/29 0909) SpO2:  [97 %-100 %] 100 % (12/29 0905)  Weight change:  Filed Weights   09/14/15 0026 09/16/15 0500  Weight: 54.8 kg (120 lb 13 oz) 57.199 kg (126 lb 1.6 oz)    Intake/Output: I/O last 3 completed shifts: In: -  Out: 405 [Urine:405]   Intake/Output this shift:  Total I/O In: -  Out: 225 [Urine:225]  Physical Exam: General: Cachetic NAD  Head: Normocephalic, atraumatic. Dry oral mucosa  Eyes: Anicteric  Neck: Supple, trachea midline  Lungs:  Clear to auscultation  Heart: S1S2 no rubs  Abdomen:  Soft, nontender, BS present   Extremities: no peripheral edema.  Neurologic: lethargic, but arousable  Skin: No lesions       Basic Metabolic Panel:  Recent Labs Lab 09/14/15 0145  09/14/15 2337 09/15/15 0514 09/16/15 0650 09/17/15 0547 09/18/15 0634  NA 163*  < > 159* 159* 153* 149* 143  K 3.5  --   --  2.6* 4.0 3.7  --   CL 123*  --   --  120* 120* 118*  --   CO2 28  --   --  31 29 27   --   GLUCOSE 107*  --   --  103* 111* 104*  --   BUN 75*  --   --  70* 58* 48*  --   CREATININE 3.60*  --   --  3.17* 2.83* 2.51* 2.21*  CALCIUM 9.1  --   --  8.7* 8.4* 8.4*  --   MG 2.3  --   --   --   --   --   --   < > = values in this interval not displayed.  Liver Function Tests:  Recent Labs Lab 09/14/15 0145  AST 28  ALT 17  ALKPHOS 72  BILITOT 4.9*  PROT 6.9  ALBUMIN 3.4*   No results for input(s): LIPASE, AMYLASE in the last 168 hours. No results for input(s): AMMONIA in the last 168 hours.  CBC:  Recent Labs Lab  09/14/15 0145 09/17/15 0547 09/18/15 0634  WBC 7.9 3.3* 3.4*  NEUTROABS 5.8  --   --   HGB 9.7* 7.4* 7.6*  HCT 28.9* 22.1* 22.4*  MCV 95.6 92.9 92.2  PLT 273 153 166    Cardiac Enzymes:  Recent Labs Lab 09/14/15 0145 09/14/15 0601 09/14/15 1105 09/14/15 1731  TROPONINI 0.16* 0.14* 0.15* 0.17*    BNP: Invalid input(s): POCBNP  CBG: No results for input(s): GLUCAP in the last 168 hours.  Microbiology: Results for orders placed or performed during the hospital encounter of 09/14/15  Urine culture     Status: None   Collection Time: 09/14/15  2:55 AM  Result Value Ref Range Status   Specimen Description URINE, RANDOM  Final   Special Requests NONE  Final   Culture MULTIPLE SPECIES PRESENT, SUGGEST RECOLLECTION  Final   Report Status 09/15/2015 FINAL  Final  MRSA PCR Screening     Status: None   Collection Time: 09/14/15  6:42 AM  Result Value Ref Range  Status   MRSA by PCR NEGATIVE NEGATIVE Final    Comment:        The GeneXpert MRSA Assay (FDA approved for NASAL specimens only), is one component of a comprehensive MRSA colonization surveillance program. It is not intended to diagnose MRSA infection nor to guide or monitor treatment for MRSA infections.   Urine culture     Status: None (Preliminary result)   Collection Time: 09/17/15  4:11 PM  Result Value Ref Range Status   Specimen Description URINE, CATHETERIZED  Final   Special Requests NONE  Final   Culture NO GROWTH < 24 HOURS  Final   Report Status PENDING  Incomplete    Coagulation Studies: No results for input(s): LABPROT, INR in the last 72 hours.  Urinalysis: No results for input(s): COLORURINE, LABSPEC, PHURINE, GLUCOSEU, HGBUR, BILIRUBINUR, KETONESUR, PROTEINUR, UROBILINOGEN, NITRITE, LEUKOCYTESUR in the last 72 hours.  Invalid input(s): APPERANCEUR    Imaging: No results found.   Medications:   . dextrose 5 % with kcl 75 mL/hr at 09/18/15 0117   . cefTRIAXone (ROCEPHIN)  IV  1 g  Intravenous Q24H  . feeding supplement (ENSURE ENLIVE)  237 mL Oral TID WC  . levothyroxine  125 mcg Oral QAC breakfast  . pantoprazole  40 mg Oral QAC breakfast  . sodium chloride  3 mL Intravenous Q12H   acetaminophen **OR** acetaminophen, haloperidol lactate, ondansetron **OR** ondansetron (ZOFRAN) IV  Assessment/ Plan:  81 y.o. male with a PMHX of severe dementia, GERD, chronic kidney disease, hypertension, arthritis, severe BPH, was admitted on 09/14/2015 with fall at the rehabilitation center, failure to thrive, acute renal failure, urinary tract infection and severe hypernatremia  1. Acute renal failure on chronic kidney disease stage III. Baseline creatinine 1.6/GFR 36 - Acute renal failure is likely secondary to volume depletion leading to ATN - renal function appears to be improving.  Creatinine down to 2.21 but still above baseline.  Continue IV fluid hydration for now and followup renal function trend daily.  2. Severe hypernatremia - sodium was initially 163.  Currently down to 143.  Continued D5W for one additional day.  Consider stopping tomorrow if by mouth water intake is adequate.  3. Hypo-kalemia -no new serum potassium today.  Yesterday this was 3.7.  Consider rechecking tomorrow.    LOS: 4 Caralynn Gelber 12/29/201612:22 PM

## 2015-09-18 NOTE — Progress Notes (Addendum)
Follow up visit made to new referral for Hospice Services at [facility]. Writer met with patient's daughter Bernard [name], son Bernard [name] and grand daughter Bernard [name] to follow up on their discussion. They have decided to have  CSW Judson Roch look for a residential bed outside of [facility] as it would be closer to them. Thank you for the opportunity to be involved in the care of this patient. Please reconsult if [facility] services are needed. Flo Shanks RN, BSN, [facility] and [facility] of [place], hospital liaison [phone] c

## 2015-09-19 DIAGNOSIS — R630 Anorexia: Secondary | ICD-10-CM

## 2015-09-19 LAB — BASIC METABOLIC PANEL
ANION GAP: 3 — AB (ref 5–15)
BUN: 30 mg/dL — ABNORMAL HIGH (ref 6–20)
CO2: 25 mmol/L (ref 22–32)
Calcium: 8.2 mg/dL — ABNORMAL LOW (ref 8.9–10.3)
Chloride: 113 mmol/L — ABNORMAL HIGH (ref 101–111)
Creatinine, Ser: 1.94 mg/dL — ABNORMAL HIGH (ref 0.61–1.24)
GFR, EST AFRICAN AMERICAN: 32 mL/min — AB (ref >60)
GFR, EST NON AFRICAN AMERICAN: 28 mL/min — AB (ref >60)
Glucose, Bld: 100 mg/dL — ABNORMAL HIGH (ref 65–99)
POTASSIUM: 4.4 mmol/L (ref 3.5–5.1)
SODIUM: 141 mmol/L (ref 135–145)

## 2015-09-19 LAB — URINE CULTURE: CULTURE: NO GROWTH

## 2015-09-19 MED ORDER — LORAZEPAM 2 MG/ML PO CONC: 1.0000 mg | Freq: Four times a day (QID) | ORAL | Status: AC | PRN

## 2015-09-19 MED ORDER — MORPHINE SULFATE (CONCENTRATE) 10 MG/0.5ML PO SOLN: 10.0000 mg | ORAL | Status: AC | PRN

## 2015-09-19 NOTE — Progress Notes (Signed)
Spoke with Bernard [name], [facility] rep at [phone], to notify of non-emergent EMS transport.  Auth notification reference given as [id number].   Service date range good from 09/19/15 - 12/18/15.   Gap exception requested to determine if services can be considered at an in-network level.

## 2015-09-19 NOTE — Clinical Documentation Improvement (Signed)
Internal Medicine  Patient's BMI 18.37, "cachectic" documented.  Can a nutritional diagnosis be provided.Thank you       Document Severity - Severe(third degree), Moderate (second degree), Mild (first degree)  Form - [place] (rarely seen in the [place], Other Condition, Unable to Determine  Other condition  Unable to clinically determine  Document any associated diagnoses/conditions   Supporting Information:   09/05/15  2:41 pm RD [place], Nutritional evaluation  "Pt with severe malnutrition in the setting of chronic illness on 08/25/2015. "  "nutritional evaluation done " Inadequate oral intake related to acute illness as evidenced by meal completion < 25%"  Please exercise your independent, professional judgment when responding. A specific answer is not anticipated or expected.   Thank You, [place] [phone]

## 2015-09-19 NOTE — Progress Notes (Addendum)
Patient had no c/o pain shift  VSS Safety sitter at beside  Patient had a poor appetite  Received MD order to discharge patient to [facility] report to Simonne Maffucci RN   Patient discharged via EMS in [place]

## 2015-09-19 NOTE — Progress Notes (Signed)
ANTIBIOTIC CONSULT NOTE - [place] for ceftriaxone Indication: UTI  No Known Allergies  Patient Measurements: Height: 5\' 8"  (172.7 cm) Weight: 126 lb 1.6 oz (57.199 kg) IBW/kg (Calculated) : 68.4 Adjusted Body Weight:   Vital Signs: BP: 107/82 mmHg (12/30 0500) Pulse Rate: 80 (12/30 0500) Intake/Output from previous day: 12/29 0701 - 12/30 0700 In: 0  Out: 650 [Urine:650] Intake/Output from this shift:    Labs:  Recent Labs  09/17/15 0547 09/18/15 0634 09/19/15 0052  WBC 3.3* 3.4*  --   HGB 7.4* 7.6*  --   PLT 153 166  --   CREATININE 2.51* 2.21* 1.94*   Estimated Creatinine Clearance: 18.8 mL/min (by C-G formula based on Cr of 1.94). No results for input(s): VANCOTROUGH, VANCOPEAK, VANCORANDOM, GENTTROUGH, GENTPEAK, GENTRANDOM, TOBRATROUGH, TOBRAPEAK, TOBRARND, AMIKACINPEAK, AMIKACINTROU, AMIKACIN in the last 72 hours.   Microbiology: Recent Results (from the past 720 hour(s))  MRSA PCR Screening     Status: None   Collection Time: 08/24/15 11:49 PM  Result Value Ref Range Status   MRSA by PCR NEGATIVE NEGATIVE Final    Comment:        The GeneXpert MRSA Assay (FDA approved for NASAL specimens only), is one component of a comprehensive MRSA colonization surveillance program. It is not intended to diagnose MRSA infection nor to guide or monitor treatment for MRSA infections.   Urine culture     Status: None   Collection Time: 09/14/15  2:55 AM  Result Value Ref Range Status   Specimen Description URINE, RANDOM  Final   Special Requests NONE  Final   Culture MULTIPLE SPECIES PRESENT, SUGGEST RECOLLECTION  Final   Report Status 09/15/2015 FINAL  Final  MRSA PCR Screening     Status: None   Collection Time: 09/14/15  6:42 AM  Result Value Ref Range Status   MRSA by PCR NEGATIVE NEGATIVE Final    Comment:        The GeneXpert MRSA Assay (FDA approved for NASAL specimens only), is one component of a comprehensive MRSA  colonization surveillance program. It is not intended to diagnose MRSA infection nor to guide or monitor treatment for MRSA infections.   Urine culture     Status: None (Preliminary result)   Collection Time: 09/17/15  4:11 PM  Result Value Ref Range Status   Specimen Description URINE, CATHETERIZED  Final   Special Requests NONE  Final   Culture NO GROWTH < 24 HOURS  Final   Report Status PENDING  Incomplete    Medical History: Past Medical History  Diagnosis Date  . Hypothyroidism   . Arthritis   . HTN (hypertension)   . Resting tremor   . GERD (gastroesophageal reflux disease)   . CKD (chronic kidney disease), stage III     Medications:  Infusions:  . dextrose 5 % with kcl 75 mL/hr at 09/19/15 0430   Assessment: 89 yom cc fall/dehydration. Pt feels weak. History of dementia, agitated in ED. Pharmacy consulted to dose ceftriaxone for UTI.  Urine cultures:  MBO Goal of Therapy:  Resolution of possible infection    Plan:  Ceftriaxone 1 gm IV Q24H. Will recommend discontinuation of therapy to MD during rounds.    Marshall Kampf D, Pharm.D., BCPS Clinical Pharmacist 09/19/2015,8:01 AM

## 2015-09-19 NOTE — Progress Notes (Signed)
[facility]  ROUNDING NOTE   Subjective:  Family at bedside. Have decided on hospice  Objective:  Vital signs in last 24 hours:  Temp:  [97.4 F (36.3 C)-97.6 F (36.4 C)] 97.6 F (36.4 C) (12/30 0954) Pulse Rate:  [67-80] 70 (12/30 1331) Resp:  [16-20] 16 (12/30 1331) BP: (90-107)/(50-82) 93/68 mmHg (12/30 1331) SpO2:  [97 %-100 %] 100 % (12/30 1331)  Weight change:  Filed Weights   09/14/15 0026 09/16/15 0500  Weight: 54.8 kg (120 lb 13 oz) 57.199 kg (126 lb 1.6 oz)    Intake/Output: I/O last 3 completed shifts: In: 0  Out: 975 [Urine:975]   Intake/Output this shift:     Physical Exam: General: Cachetic NAD  Head: Normocephalic, atraumatic. Dry oral mucosa  Eyes: Anicteric  Neck: Supple, trachea midline  Lungs:  Clear to auscultation  Heart: S1S2 no rubs  Abdomen:  Soft, nontender, BS present   Extremities: no peripheral edema.  Neurologic: lethargic, but arousable  Skin: No lesions       Basic Metabolic Panel:  Recent Labs Lab 09/14/15 0145  09/15/15 0514 09/16/15 0650 09/17/15 0547 09/18/15 0634 09/19/15 0052  NA 163*  < > 159* 153* 149* 143 141  K 3.5  --  2.6* 4.0 3.7  --  4.4  CL 123*  --  120* 120* 118*  --  113*  CO2 28  --  31 29 27   --  25  GLUCOSE 107*  --  103* 111* 104*  --  100*  BUN 75*  --  70* 58* 48*  --  30*  CREATININE 3.60*  --  3.17* 2.83* 2.51* 2.21* 1.94*  CALCIUM 9.1  --  8.7* 8.4* 8.4*  --  8.2*  MG 2.3  --   --   --   --   --   --   < > = values in this interval not displayed.  Liver Function Tests:  Recent Labs Lab 09/14/15 0145  AST 28  ALT 17  ALKPHOS 72  BILITOT 4.9*  PROT 6.9  ALBUMIN 3.4*   No results for input(s): LIPASE, AMYLASE in the last 168 hours. No results for input(s): AMMONIA in the last 168 hours.  CBC:  Recent Labs Lab 09/14/15 0145 09/17/15 0547 09/18/15 0634  WBC 7.9 3.3* 3.4*  NEUTROABS 5.8  --   --   HGB 9.7* 7.4* 7.6*  HCT 28.9* 22.1* 22.4*  MCV 95.6 92.9 92.2   PLT 273 153 166    Cardiac Enzymes:  Recent Labs Lab 09/14/15 0145 09/14/15 0601 09/14/15 1105 09/14/15 1731  TROPONINI 0.16* 0.14* 0.15* 0.17*    BNP: Invalid input(s): POCBNP  CBG: No results for input(s): GLUCAP in the last 168 hours.  Microbiology: Results for orders placed or performed during the hospital encounter of 09/14/15  Urine culture     Status: None   Collection Time: 09/14/15  2:55 AM  Result Value Ref Range Status   Specimen Description URINE, RANDOM  Final   Special Requests NONE  Final   Culture MULTIPLE SPECIES PRESENT, SUGGEST RECOLLECTION  Final   Report Status 09/15/2015 FINAL  Final  MRSA PCR Screening     Status: None   Collection Time: 09/14/15  6:42 AM  Result Value Ref Range Status   MRSA by PCR NEGATIVE NEGATIVE Final    Comment:        The GeneXpert MRSA Assay (FDA approved for NASAL specimens only), is one component of a comprehensive MRSA  colonization surveillance program. It is not intended to diagnose MRSA infection nor to guide or monitor treatment for MRSA infections.   Urine culture     Status: None   Collection Time: 09/17/15  4:11 PM  Result Value Ref Range Status   Specimen Description URINE, CATHETERIZED  Final   Special Requests NONE  Final   Culture NO GROWTH 2 DAYS  Final   Report Status 09/19/2015 FINAL  Final    Coagulation Studies: No results for input(s): LABPROT, INR in the last 72 hours.  Urinalysis: No results for input(s): COLORURINE, LABSPEC, PHURINE, GLUCOSEU, HGBUR, BILIRUBINUR, KETONESUR, PROTEINUR, UROBILINOGEN, NITRITE, LEUKOCYTESUR in the last 72 hours.  Invalid input(s): APPERANCEUR    Imaging: No results found.   Medications:   . dextrose 5 % with kcl 75 mL/hr at 09/19/15 0430   . cefTRIAXone (ROCEPHIN)  IV  1 g Intravenous Q24H  . feeding supplement (ENSURE ENLIVE)  237 mL Oral TID WC  . levothyroxine  125 mcg Oral QAC breakfast  . pantoprazole  40 mg Oral QAC breakfast  . sodium  chloride  3 mL Intravenous Q12H   acetaminophen **OR** acetaminophen, haloperidol lactate, ondansetron **OR** ondansetron (ZOFRAN) IV  Assessment/ Plan:  81 y.o. male with a PMHX of severe dementia, GERD, chronic kidney disease, hypertension, arthritis, severe BPH, was admitted on 09/14/2015 with fall at the rehabilitation center, failure to thrive, acute renal failure, urinary tract infection and severe hypernatremia  1. Acute renal failure on chronic kidney disease stage III. Baseline creatinine 1.6/GFR 36 - Acute renal failure is likely secondary to volume depletion leading to ATN - renal function appears to be improving with IV fluids.   2. Hypernatremia: improved with free water replacement.   3. Hypokalemia: improved.    LOS: 5 Avigail Pilling 12/30/20161:49 PM

## 2015-09-19 NOTE — Discharge Summary (Signed)
[name] NAME: Bernard [name]    MR#:  [id number]  DATE OF BIRTH:  23-Feb-1919  DATE OF ADMISSION:  09/14/2015 ADMITTING PHYSICIAN: Lance Coon, MD  DATE OF DISCHARGE: No discharge date for patient encounter.  PRIMARY CARE PHYSICIAN: No primary care provider on file.     ADMISSION DIAGNOSIS:  Dehydration [E86.0] Hypernatremia [E87.0] Elevated troponin I level 123XX123 Complicated UTI (urinary tract infection) [N39.0] Acute renal failure, unspecified acute renal failure type ([place]) [N17.9] Altered mental status, unspecified altered mental status type [R41.82]  DISCHARGE DIAGNOSIS:  Principal Problem:   Acute-on-chronic kidney injury ([place]) Active Problems:   Hypernatremia   Dementia   Elevated troponin   UTI (lower urinary tract infection)   Anorexia   Hypothyroidism   HTN (hypertension)   GERD (gastroesophageal reflux disease)   SECONDARY DIAGNOSIS:   Past Medical History  Diagnosis Date  . Hypothyroidism   . Arthritis   . HTN (hypertension)   . Resting tremor   . GERD (gastroesophageal reflux disease)   . CKD (chronic kidney disease), stage III     .pro HOSPITAL COURSE:   The patient is 81 year old Caucasian male with past medical history significant for the History of dementia, hypertension, hypothyroidism CK D, who presents to the hospital with complaints of generalized weakness and poor by mouth intake. Apparently patient had a fall and was sent to emergency room for further evaluation. In emergency room, he was noted to have sodium level of 163 and troponin was slightly elevated, so patient was admitted. No radiology studies were performed. Patient's labs were also significant for acute on chronic renal failure with creatinine level of 3.6. Anemia with hemoglobin level of 9.7. Urinalysis was concerning for urinary tract infection due to positive nitrites. Patient was initiated on IV fluids,  broad-spectrum antibiotic therapy with Rocephin. Blood cultures came back negative. Initial urine culture was positive for multiple species recollection was suggested. Urine culture after recollection was negative. Patient was hydrated and his sodium level normalized. His creatinine also improved to 1.94 on 09/18/2015, with estimated GFR of 32, patient's baseline. Patient's condition, however, did not improve. He remained somnolent and agitated intermittently and did not eat. Palliative care was consulted and patient is being discharged to hospice facility today for final care. Discussion by problem #1 Hypernatremia , due to profound dehydration, fluid depletion. Improved but sodium will likely worsen when patient is off IV fluids since oral intake remains negligent. Patient is being discharged to hospice facility for final care   #2 Acute-on-chronic kidney injury [facility]) - due to poor by mouth intake and UTI. Improved with IV fluid administration to baseline. Patient's oral intake remains minimal, very likely kidney function will deteriorate again when patient is off IV fluids   #3 Hypokalemia. K up to 4.0 from 2.6.  Magnesium was normal.  #4 UTI (lower urinary tract infection) - nitrite-positive urine. Urine cultures revealed multiple species suggested recollection, repeated urinary cultures revealed no growth  #5 Elevated troponin - likely due to demand ischemia and renal failure. No intervention  #6 HTN (hypertension), was hypotensive, holding all antihypertensives.   #7 Dementia with behavioral problems - the patient has been declining mentally as per patient's daughter. Supportive care  #8 Hypothyroidism - was on thyroid replacement, discontinued now  DISCHARGE CONDITIONS:   Poor  CONSULTS OBTAINED:  Treatment Team:  Murlean Iba, MD  DRUG ALLERGIES:  No Known Allergies  DISCHARGE MEDICATIONS:   Current Discharge Medication List  START taking these medications   Details   LORazepam (ATIVAN) 2 MG/ML concentrated solution Take 0.5 mLs (1 mg total) by mouth every 6 (six) hours as needed for anxiety. Qty: 30 mL, Refills: 0    Morphine Sulfate (MORPHINE CONCENTRATE) 10 MG/0.5ML SOLN concentrated solution Take 0.5 mLs (10 mg total) by mouth every 3 (three) hours as needed for moderate pain, severe pain or shortness of breath. Qty: 30 mL, Refills: 0      CONTINUE these medications which have NOT CHANGED   Details  acetaminophen (TYLENOL) 325 MG tablet Take 2 tablets (650 mg total) by mouth every 6 (six) hours as needed for mild pain (or Fever >/= 101).    feeding supplement, ENSURE ENLIVE, (ENSURE ENLIVE) LIQD Take 237 mLs by mouth 2 (two) times daily between meals. Qty: 237 mL, Refills: 12    furosemide (LASIX) 40 MG tablet Take 0.5 tablets (20 mg total) by mouth daily. Qty: 30 tablet, Refills: 0    levothyroxine (SYNTHROID, LEVOTHROID) 125 MCG tablet Take 125 mcg by mouth daily before breakfast.    Multiple Vitamin (MULTIVITAMIN) tablet Take 1 tablet by mouth daily.    ondansetron (ZOFRAN) 4 MG tablet Take 1 tablet (4 mg total) by mouth every 6 (six) hours as needed for nausea. Qty: 20 tablet, Refills: 0    oxyCODONE (OXY IR/ROXICODONE) 5 MG immediate release tablet Take 1 tablet (5 mg total) by mouth every 4 (four) hours as needed for moderate pain. Qty: 30 tablet, Refills: 0    pantoprazole (PROTONIX) 40 MG tablet Take 1 tablet (40 mg total) by mouth daily before breakfast. Qty: 30 tablet, Refills: 0    vitamin B-12 (CYANOCOBALAMIN) 100 MCG tablet Take 100 mcg by mouth daily.         DISCHARGE INSTRUCTIONS:    No follow-up  If you experience worsening of your admission symptoms, develop shortness of breath, life threatening emergency, suicidal or homicidal thoughts you must seek medical attention immediately by calling 911 or calling your MD immediately  if symptoms less severe.  You Must read complete instructions/literature along with all  the possible adverse reactions/side effects for all the Medicines you take and that have been prescribed to you. Take any new Medicines after you have completely understood and accept all the possible adverse reactions/side effects.   Please note  You were cared for by a hospitalist during your hospital stay. If you have any questions about your discharge medications or the care you received while you were in the hospital after you are discharged, you can call the unit and asked to speak with the hospitalist on call if the hospitalist that took care of you is not available. Once you are discharged, your primary care physician will handle any further medical issues. Please note that NO REFILLS for any discharge medications will be authorized once you are discharged, as it is imperative that you return to your primary care physician (or establish a relationship with a primary care physician if you do not have one) for your aftercare needs so that they can reassess your need for medications and monitor your lab values.    Today   CHIEF COMPLAINT:   Chief Complaint  Patient presents with  . Fall    HISTORY OF PRESENT ILLNESS:  Bernard [name]  is a 81 y.o. male with a known history of History of dementia, hypertension, hypothyroidism CK D, who presents to the hospital with complaints of generalized weakness and poor by mouth intake. Apparently patient  had a fall and was sent to emergency room for further evaluation. In emergency room, he was noted to have sodium level of 163 and troponin was slightly elevated, so patient was admitted. No radiology studies were performed. Patient's labs were also significant for acute on chronic renal failure with creatinine level of 3.6. Anemia with hemoglobin level of 9.7. Urinalysis was concerning for urinary tract infection due to positive nitrites. Patient was initiated on IV fluids, broad-spectrum antibiotic therapy with Rocephin. Blood cultures came back negative.  Initial urine culture was positive for multiple species recollection was suggested. Urine culture after recollection was negative. Patient was hydrated and his sodium level normalized. His creatinine also improved to 1.94 on 09/18/2015, with estimated GFR of 32, patient's baseline. Patient's condition, however, did not improve. He remained somnolent and agitated intermittently and did not eat. Palliative care was consulted and patient is being discharged to hospice facility today for final care. Discussion by problem #1 Hypernatremia , due to profound dehydration, fluid depletion. Improved but sodium will likely worsen when patient is off IV fluids since oral intake remains negligent. Patient is being discharged to hospice facility for final care   #2 Acute-on-chronic kidney injury [facility]) - due to poor by mouth intake and UTI. Improved with IV fluid administration to baseline. Patient's oral intake remains minimal, very likely kidney function will deteriorate again when patient is off IV fluids   #3 Hypokalemia. K up to 4.0 from 2.6.  Magnesium was normal.  #4 UTI (lower urinary tract infection) - nitrite-positive urine. Urine cultures revealed multiple species suggested recollection, repeated urinary cultures revealed no growth  #5 Elevated troponin - likely due to demand ischemia and renal failure. No intervention  #6 HTN (hypertension), was hypotensive, holding all antihypertensives.   #7 Dementia with behavioral problems - the patient has been declining mentally as per patient's daughter. Supportive care  #8 Hypothyroidism - was on thyroid replacement, discontinued now     VITAL SIGNS:  Blood pressure 101/65, pulse 70, temperature 97.6 F (36.4 C), temperature source Oral, resp. rate 20, height 5\' 8"  (1.727 m), weight 57.199 kg (126 lb 1.6 oz), SpO2 100 %.  I/O:   Intake/Output Summary (Last 24 hours) at 09/19/15 1323 Last data filed at 09/19/15 0800  Gross per 24 hour  Intake       0 ml  Output    425 ml  Net   -425 ml    PHYSICAL EXAMINATION:  GENERAL:  81 y.o.-year-old patient lying in the bed with no acute distress.  EYES: Pupils equal, round, reactive to light and accommodation. No scleral icterus. Extraocular muscles intact.  HEENT: Head atraumatic, normocephalic. Oropharynx and nasopharynx clear.  NECK:  Supple, no jugular venous distention. No thyroid enlargement, no tenderness.  LUNGS: Normal breath sounds bilaterally, no wheezing, rales,rhonchi or crepitation. No use of accessory muscles of respiration.  CARDIOVASCULAR: S1, S2 normal. No murmurs, rubs, or gallops.  ABDOMEN: Soft, non-tender, non-distended. Bowel sounds present. No organomegaly or mass.  EXTREMITIES: No pedal edema, cyanosis, or clubbing.  NEUROLOGIC: Cranial nerves II through XII are intact. Muscle strength 5/5 in all extremities. Sensation intact. Gait not checked.  PSYCHIATRIC: The patient is alert and oriented x 3.  SKIN: No obvious rash, lesion, or ulcer.   DATA REVIEW:   CBC  Recent Labs Lab 09/18/15 0634  WBC 3.4*  HGB 7.6*  HCT 22.4*  PLT 166    Chemistries   Recent Labs Lab 09/14/15 0145  09/19/15 0052  NA 163*  < >  141  K 3.5  < > 4.4  CL 123*  < > 113*  CO2 28  < > 25  GLUCOSE 107*  < > 100*  BUN 75*  < > 30*  CREATININE 3.60*  < > 1.94*  CALCIUM 9.1  < > 8.2*  MG 2.3  --   --   AST 28  --   --   ALT 17  --   --   ALKPHOS 72  --   --   BILITOT 4.9*  --   --   < > = values in this interval not displayed.  Cardiac Enzymes  Recent Labs Lab 09/14/15 1731  TROPONINI 0.17*    Microbiology Results  Results for orders placed or performed during the hospital encounter of 09/14/15  Urine culture     Status: None   Collection Time: 09/14/15  2:55 AM  Result Value Ref Range Status   Specimen Description URINE, RANDOM  Final   Special Requests NONE  Final   Culture MULTIPLE SPECIES PRESENT, SUGGEST RECOLLECTION  Final   Report Status 09/15/2015 FINAL   Final  MRSA PCR Screening     Status: None   Collection Time: 09/14/15  6:42 AM  Result Value Ref Range Status   MRSA by PCR NEGATIVE NEGATIVE Final    Comment:        The GeneXpert MRSA Assay (FDA approved for NASAL specimens only), is one component of a comprehensive MRSA colonization surveillance program. It is not intended to diagnose MRSA infection nor to guide or monitor treatment for MRSA infections.   Urine culture     Status: None   Collection Time: 09/17/15  4:11 PM  Result Value Ref Range Status   Specimen Description URINE, CATHETERIZED  Final   Special Requests NONE  Final   Culture NO GROWTH 2 DAYS  Final   Report Status 09/19/2015 FINAL  Final    RADIOLOGY:  No results found.  EKG:   Orders placed or performed during the hospital encounter of 09/14/15  . ED EKG  . ED EKG  . EKG 12-Lead  . EKG 12-Lead  . EKG 12-Lead  . EKG 12-Lead      Management plans discussed with the patient, family and they are in agreement.  CODE STATUS:     Code Status Orders        Start     Ordered   09/14/15 0503  Do not attempt resuscitation (DNR)   Continuous    Question Answer Comment  In the event of cardiac or respiratory ARREST Do not call a "code blue"   In the event of cardiac or respiratory ARREST Do not perform Intubation, CPR, defibrillation or ACLS   In the event of cardiac or respiratory ARREST Use medication by any route, position, wound care, and other measures to relive pain and suffering. May use oxygen, suction and manual treatment of airway obstruction as needed for comfort.      09/14/15 0502    Advance Directive Documentation        Most Recent Value   Type of Advance Directive  Healthcare Power of Attorney   Pre-existing out of facility DNR order (yellow form or pink MOST form)     "MOST" Form in Place?        TOTAL TIME TAKING CARE OF THIS PATIENT: 40 minutes.    Theodoro Grist M.D on 09/19/2015 at 1:23 PM  Between 7am to 6pm -  Pager - [phone]  After  6pm go to [email or web address] - password [email or web address] [facility]  [facility]  Office  [phone]  CC: Primary care physician; No primary care provider on file.

## 2015-09-19 NOTE — Clinical Social Work Note (Signed)
Pt is ready for discharge today and will go to [facility]. Facility has received information. Pt's family is aware and in agreement to discharge plan. CSW will send discharge summary when available. RN will call report and EMS will provide transportation. CSW is signing off as no further needs identified.   Darden Dates, MSW, LCSW  Clinical Social Worker  [phone]

## 2015-10-22 DEATH — deceased
